# Patient Record
Sex: Female | Born: 1935 | Race: White | Hispanic: No | Smoking: Never smoker
Health system: Southern US, Community
[De-identification: ages and names within clinical notes are randomized; demographics above are authoritative.]

## PROBLEM LIST (undated history)

## (undated) DIAGNOSIS — M6281 Muscle weakness (generalized): Secondary | ICD-10-CM

## (undated) DIAGNOSIS — F0390 Unspecified dementia without behavioral disturbance: Secondary | ICD-10-CM

## (undated) DIAGNOSIS — K219 Gastro-esophageal reflux disease without esophagitis: Secondary | ICD-10-CM

## (undated) DIAGNOSIS — F419 Anxiety disorder, unspecified: Secondary | ICD-10-CM

## (undated) DIAGNOSIS — K5790 Diverticulosis of intestine, part unspecified, without perforation or abscess without bleeding: Secondary | ICD-10-CM

## (undated) DIAGNOSIS — E039 Hypothyroidism, unspecified: Secondary | ICD-10-CM

## (undated) DIAGNOSIS — E119 Type 2 diabetes mellitus without complications: Secondary | ICD-10-CM

---

## 2004-07-22 ENCOUNTER — Emergency Department: Payer: Self-pay | Admitting: Emergency Medicine

## 2004-07-22 ENCOUNTER — Other Ambulatory Visit: Payer: Self-pay

## 2005-12-19 ENCOUNTER — Other Ambulatory Visit: Payer: Self-pay

## 2005-12-19 ENCOUNTER — Emergency Department: Payer: Self-pay | Admitting: Emergency Medicine

## 2006-06-01 ENCOUNTER — Other Ambulatory Visit: Payer: Self-pay

## 2006-06-01 ENCOUNTER — Emergency Department: Payer: Self-pay | Admitting: Emergency Medicine

## 2006-06-18 ENCOUNTER — Ambulatory Visit: Payer: Self-pay | Admitting: Gastroenterology

## 2006-12-18 ENCOUNTER — Other Ambulatory Visit: Payer: Self-pay

## 2006-12-18 ENCOUNTER — Inpatient Hospital Stay: Payer: Self-pay | Admitting: *Deleted

## 2007-01-05 ENCOUNTER — Emergency Department: Payer: Self-pay | Admitting: Unknown Physician Specialty

## 2008-04-19 ENCOUNTER — Other Ambulatory Visit: Payer: Self-pay

## 2008-04-19 ENCOUNTER — Emergency Department: Payer: Self-pay | Admitting: Internal Medicine

## 2008-11-02 ENCOUNTER — Emergency Department: Payer: Self-pay

## 2008-12-24 ENCOUNTER — Emergency Department: Payer: Self-pay | Admitting: Emergency Medicine

## 2010-03-08 ENCOUNTER — Emergency Department: Payer: Self-pay | Admitting: Emergency Medicine

## 2010-04-17 ENCOUNTER — Emergency Department: Payer: Self-pay | Admitting: Emergency Medicine

## 2010-10-04 IMAGING — CR DG SHOULDER 3+V*R*
1 series · 3 of 3 positions shown · non-contrast
Comparison: none

REASON FOR EXAM: pain and swelling
COMMENTS:

PROCEDURE:     DXR - DXR SHOULDER RIGHT COMPLETE  - April 17, 2010  [DATE]
RESULT:
A comminuted humeral head fracture is appreciated. There does not appear to
be significant angulation or displacement. This extends into the humeral
head and neck and proximal humeral shaft.

[Series 1: view not recorded · 0.17mm/px · 3 of 3 slices shown]
[im 1/3]
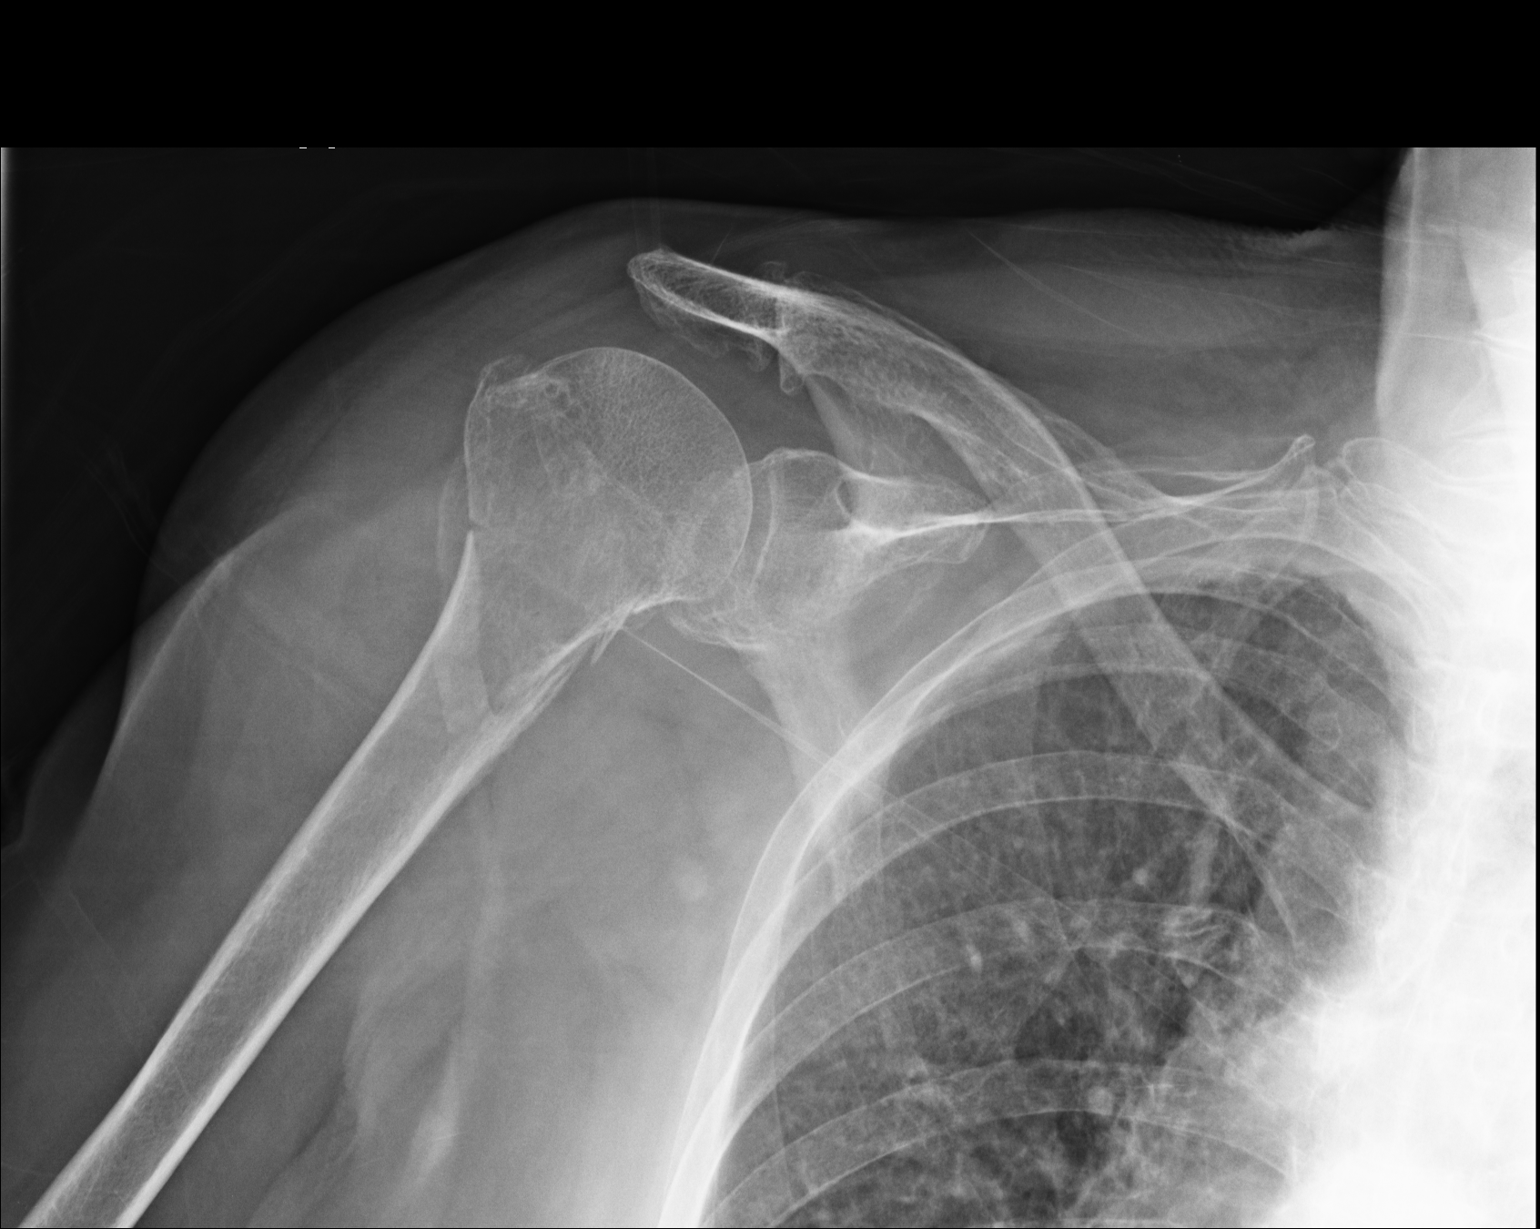
[im 2/3]
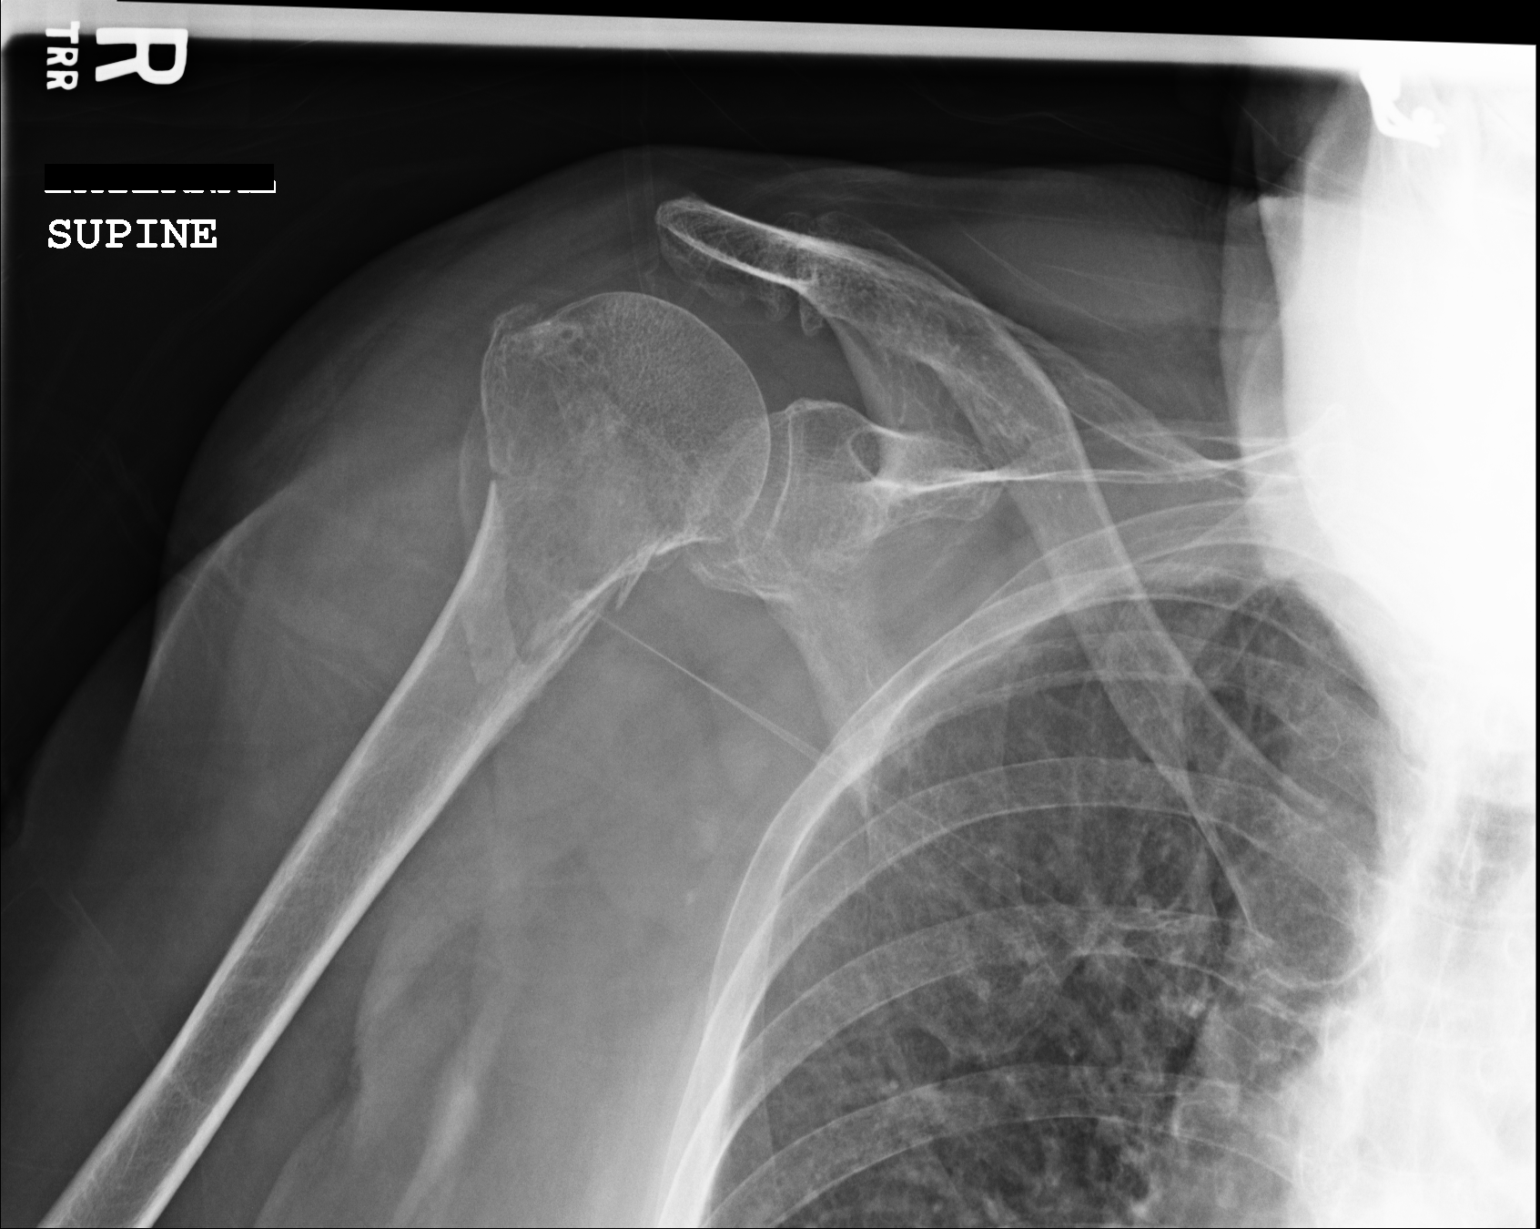
[im 3/3]
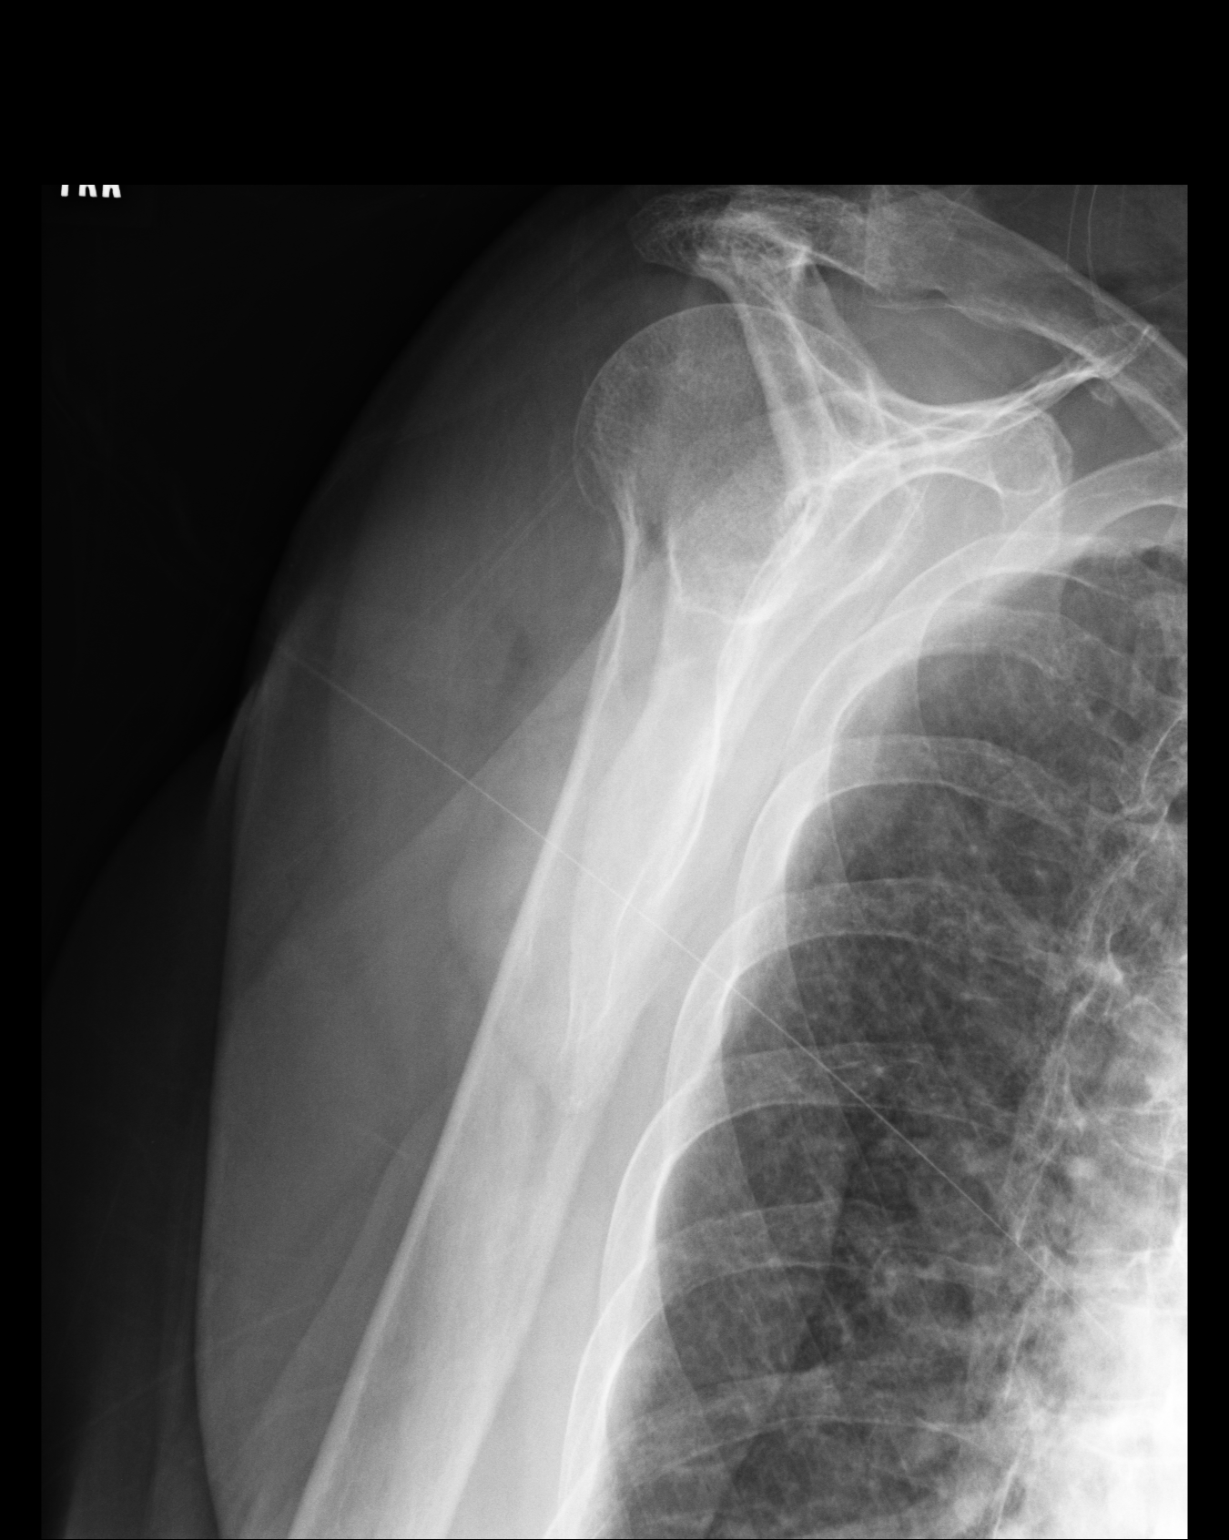

[3 of 3 positions shown; findings below may reference images not displayed]

IMPRESSION: Humeral head and neck fracture as described above with
extension into the humeral shaft.

## 2012-07-19 ENCOUNTER — Emergency Department: Payer: Self-pay | Admitting: Emergency Medicine

## 2012-07-20 LAB — URINALYSIS, COMPLETE
Bilirubin,UR: NEGATIVE
Blood: NEGATIVE
Glucose,UR: NEGATIVE mg/dL (ref 0–75)
Ketone: NEGATIVE
Ph: 6 (ref 4.5–8.0)
RBC,UR: <1 /HPF (ref 0–5)
Squamous Epithelial: NONE SEEN

## 2012-07-20 LAB — CBC
HCT: 39 % (ref 35.0–47.0)
HGB: 13.1 g/dL (ref 12.0–16.0)
MCHC: 33.7 g/dL (ref 32.0–36.0)
Platelet: 165 10*3/uL (ref 150–440)
RBC: 4.35 10*6/uL (ref 3.80–5.20)
RDW: 13.9 % (ref 11.5–14.5)

## 2012-07-20 LAB — COMPREHENSIVE METABOLIC PANEL
Albumin: 3.5 g/dL (ref 3.4–5.0)
Anion Gap: 5 — ABNORMAL LOW (ref 7–16)
BUN: 19 mg/dL — ABNORMAL HIGH (ref 7–18)
Calcium, Total: 8.8 mg/dL (ref 8.5–10.1)
EGFR (African American): >60
Glucose: 100 mg/dL — ABNORMAL HIGH (ref 65–99)
Potassium: 4.1 mmol/L (ref 3.5–5.1)
SGOT(AST): 26 U/L (ref 15–37)
SGPT (ALT): 25 U/L (ref 12–78)
Sodium: 136 mmol/L (ref 136–145)
Total Protein: 7.2 g/dL (ref 6.4–8.2)

## 2012-07-20 LAB — CK TOTAL AND CKMB (NOT AT ARMC): CK, Total: 35 U/L (ref 21–215)

## 2013-05-09 ENCOUNTER — Emergency Department: Payer: Self-pay | Admitting: Emergency Medicine

## 2013-05-09 LAB — CBC WITH DIFFERENTIAL/PLATELET
Basophil #: 0.1 10*3/uL (ref 0.0–0.1)
Eosinophil #: 0.3 10*3/uL (ref 0.0–0.7)
HGB: 13.6 g/dL (ref 12.0–16.0)
MCH: 30.1 pg (ref 26.0–34.0)
MCHC: 33.2 g/dL (ref 32.0–36.0)
Monocyte #: 0.7 x10 3/mm (ref 0.2–0.9)
Monocyte %: 7.7 %
Neutrophil #: 6 10*3/uL (ref 1.4–6.5)
RBC: 4.5 10*6/uL (ref 3.80–5.20)
WBC: 8.5 10*3/uL (ref 3.6–11.0)

## 2013-05-09 LAB — PHOSPHORUS: Phosphorus: 3.3 mg/dL (ref 2.5–4.9)

## 2013-05-09 LAB — MAGNESIUM: Magnesium: 2 mg/dL

## 2013-05-10 LAB — LIPASE, BLOOD: Lipase: 288 U/L (ref 73–393)

## 2013-05-10 LAB — URINALYSIS, COMPLETE
Bilirubin,UR: NEGATIVE
Glucose,UR: NEGATIVE mg/dL (ref 0–75)
Ketone: NEGATIVE
Nitrite: NEGATIVE
Ph: 6 (ref 4.5–8.0)
Protein: NEGATIVE
RBC,UR: 6 /HPF (ref 0–5)
WBC UR: 24 /HPF (ref 0–5)

## 2013-05-10 LAB — COMPREHENSIVE METABOLIC PANEL
Anion Gap: 10 (ref 7–16)
BUN: 15 mg/dL (ref 7–18)
Chloride: 98 mmol/L (ref 98–107)
Co2: 24 mmol/L (ref 21–32)
Glucose: 126 mg/dL — ABNORMAL HIGH (ref 65–99)
Osmolality: 267 (ref 275–301)
SGOT(AST): 23 U/L (ref 15–37)
SGPT (ALT): 24 U/L (ref 12–78)
Sodium: 132 mmol/L — ABNORMAL LOW (ref 136–145)
Total Protein: 7.6 g/dL (ref 6.4–8.2)

## 2013-05-10 LAB — CK TOTAL AND CKMB (NOT AT ARMC)
CK, Total: 29 U/L (ref 21–215)
CK-MB: 1.2 ng/mL (ref 0.5–3.6)

## 2013-05-10 LAB — TROPONIN I
Troponin-I: <0.02 ng/mL
Troponin-I: <0.02 ng/mL

## 2014-06-02 ENCOUNTER — Inpatient Hospital Stay: Payer: Self-pay | Admitting: Internal Medicine

## 2014-06-02 LAB — CBC WITH DIFFERENTIAL/PLATELET
BASOS ABS: 0.1 10*3/uL (ref 0.0–0.1)
BASOS PCT: 1 %
Basophil #: 0.1 10*3/uL (ref 0.0–0.1)
Basophil #: 0.1 10*3/uL (ref 0.0–0.1)
Basophil %: 0.8 %
Basophil %: 1.1 %
Eosinophil #: 0.2 10*3/uL (ref 0.0–0.7)
Eosinophil #: 0.2 10*3/uL (ref 0.0–0.7)
Eosinophil #: 0.1 10*3/uL (ref 0.0–0.7)
Eosinophil %: 2.8 %
Eosinophil %: 2.9 %
Eosinophil %: 1.9 %
HCT: 38.8 % (ref 35.0–47.0)
HCT: 38.8 % (ref 35.0–47.0)
HCT: 39.5 % (ref 35.0–47.0)
HGB: 12.9 g/dL (ref 12.0–16.0)
HGB: 12.4 g/dL (ref 12.0–16.0)
HGB: 12.3 g/dL (ref 12.0–16.0)
LYMPHS ABS: 0.9 10*3/uL — AB (ref 1.0–3.6)
LYMPHS ABS: 1.1 10*3/uL (ref 1.0–3.6)
Lymphocyte #: 1.1 10*3/uL (ref 1.0–3.6)
Lymphocyte %: 14.2 %
Lymphocyte %: 17.1 %
Lymphocyte %: 10.9 %
MCH: 30.5 pg (ref 26.0–34.0)
MCH: 29.7 pg (ref 26.0–34.0)
MCH: 29.8 pg (ref 26.0–34.0)
MCHC: 32.8 g/dL (ref 32.0–36.0)
MCHC: 31.6 g/dL — ABNORMAL LOW (ref 32.0–36.0)
MCHC: 32 g/dL (ref 32.0–36.0)
MCV: 93 fL (ref 80–100)
MCV: 94 fL (ref 80–100)
MCV: 93 fL (ref 80–100)
MONOS PCT: 9 %
MONOS PCT: 9.6 %
Monocyte #: 0.7 x10 3/mm (ref 0.2–0.9)
Monocyte #: 0.6 x10 3/mm (ref 0.2–0.9)
Monocyte #: 0.7 x10 3/mm (ref 0.2–0.9)
Monocyte %: 9.7 %
NEUTROS ABS: 5.5 10*3/uL (ref 1.4–6.5)
NEUTROS PCT: 72.4 %
NEUTROS PCT: 77.1 %
Neutrophil #: 4.6 10*3/uL (ref 1.4–6.5)
Neutrophil #: 6 10*3/uL (ref 1.4–6.5)
Neutrophil %: 69.5 %
PLATELETS: 184 10*3/uL (ref 150–440)
PLATELETS: 192 10*3/uL (ref 150–440)
Platelet: 171 10*3/uL (ref 150–440)
RBC: 4.25 10*6/uL (ref 3.80–5.20)
RBC: 4.13 10*6/uL (ref 3.80–5.20)
RBC: 4.16 10*6/uL (ref 3.80–5.20)
RDW: 13.9 % (ref 11.5–14.5)
RDW: 13.8 % (ref 11.5–14.5)
RDW: 13.7 % (ref 11.5–14.5)
WBC: 7.6 10*3/uL (ref 3.6–11.0)
WBC: 6.6 10*3/uL (ref 3.6–11.0)
WBC: 7.8 10*3/uL (ref 3.6–11.0)

## 2014-06-02 LAB — COMPREHENSIVE METABOLIC PANEL
Albumin: 3.2 g/dL — ABNORMAL LOW (ref 3.4–5.0)
Alkaline Phosphatase: 42 U/L — ABNORMAL LOW
Anion Gap: 8 (ref 7–16)
BUN: 14 mg/dL (ref 7–18)
Bilirubin,Total: 0.4 mg/dL (ref 0.2–1.0)
CALCIUM: 8.3 mg/dL — AB (ref 8.5–10.1)
CREATININE: 0.82 mg/dL (ref 0.60–1.30)
Chloride: 100 mmol/L (ref 98–107)
Co2: 28 mmol/L (ref 21–32)
EGFR (African American): >60
GLUCOSE: 94 mg/dL (ref 65–99)
OSMOLALITY: 272 (ref 275–301)
Potassium: 3.9 mmol/L (ref 3.5–5.1)
SGOT(AST): 26 U/L (ref 15–37)
SGPT (ALT): 22 U/L
Sodium: 136 mmol/L (ref 136–145)
Total Protein: 6.7 g/dL (ref 6.4–8.2)

## 2014-06-02 LAB — TROPONIN I: Troponin-I: <0.02 ng/mL

## 2014-06-02 LAB — CK TOTAL AND CKMB (NOT AT ARMC)
CK, Total: 57 U/L
CK-MB: 2 ng/mL (ref 0.5–3.6)

## 2014-06-02 LAB — OCCULT BLOOD X 1 CARD TO LAB, STOOL: Occult Blood, Feces: NEGATIVE

## 2014-06-03 LAB — URINALYSIS, COMPLETE
BILIRUBIN, UR: NEGATIVE
Blood: NEGATIVE
GLUCOSE, UR: NEGATIVE mg/dL (ref 0–75)
Ketone: NEGATIVE
Nitrite: NEGATIVE
PH: 5 (ref 4.5–8.0)
PROTEIN: NEGATIVE
SPECIFIC GRAVITY: 1.009 (ref 1.003–1.030)
Squamous Epithelial: 2
WBC UR: 198 /HPF (ref 0–5)

## 2014-06-03 LAB — HEMOGLOBIN: HGB: 12.2 g/dL (ref 12.0–16.0)

## 2014-06-04 LAB — CBC WITH DIFFERENTIAL/PLATELET
BASOS PCT: 0.5 %
Basophil #: 0 10*3/uL (ref 0.0–0.1)
EOS PCT: 2.6 %
Eosinophil #: 0.2 10*3/uL (ref 0.0–0.7)
HCT: 37.9 % (ref 35.0–47.0)
HGB: 12.4 g/dL (ref 12.0–16.0)
Lymphocyte #: 0.6 10*3/uL — ABNORMAL LOW (ref 1.0–3.6)
Lymphocyte %: 8.2 %
MCH: 30.6 pg (ref 26.0–34.0)
MCHC: 32.8 g/dL (ref 32.0–36.0)
MCV: 93 fL (ref 80–100)
Monocyte #: 0.7 x10 3/mm (ref 0.2–0.9)
Monocyte %: 9.9 %
NEUTROS PCT: 78.8 %
Neutrophil #: 6 10*3/uL (ref 1.4–6.5)
Platelet: 165 10*3/uL (ref 150–440)
RBC: 4.07 10*6/uL (ref 3.80–5.20)
RDW: 13.8 % (ref 11.5–14.5)
WBC: 7.6 10*3/uL (ref 3.6–11.0)

## 2014-06-05 LAB — URINE CULTURE

## 2014-06-06 ENCOUNTER — Encounter: Payer: Self-pay | Admitting: Internal Medicine

## 2014-06-25 ENCOUNTER — Encounter: Payer: Self-pay | Admitting: Internal Medicine

## 2014-07-25 ENCOUNTER — Encounter: Payer: Self-pay | Admitting: Internal Medicine

## 2014-08-25 ENCOUNTER — Encounter: Payer: Self-pay | Admitting: Internal Medicine

## 2014-12-16 NOTE — H&P (Signed)
PATIENT NAME:  Madison Wallace, Madison Wallace MR#:  [id number] DATE OF BIRTH:  [birth year]  DATE OF ADMISSION:  06/02/2014  REFERRING PHYSICIAN:  Enedina Finnerandolph N. Manson PasseyBrown, MD   PRIMARY CARE PHYSICIAN:  [facility], Lorayne BenderKatherine Jones, PA-C   CHIEF COMPLAINT:  Bright red blood per rectum.   HISTORY OF PRESENT ILLNESS:  A 81 year old Caucasian female with past medical history of dementia, as well as gastroesophageal reflux disease and anxiety not otherwise specified, presenting with bright red blood per rectum. The [facility] [place]. Apparently, she was going to the restroom and nursing staff noted bright red blood per rectum in the toilet bowl, thus sent the patient to the hospital for further workup and evaluation. The patient herself is unable to provide any reliable information given baseline mental status; however, she denies any chest pain, palpitations, or further symptomatology. She has had no further episodes of bleeding thus far in the Emergency Department.   REVIEW OF SYSTEMS:  Unobtainable given the patient's baseline mental status.   PAST MEDICAL HISTORY:  Type 2 diabetes on no medication, gastroesophageal reflux disease, anxiety and depression not otherwise specified.   SOCIAL HISTORY:  No alcohol, tobacco, or drug usage. She ambulates with a walker at baseline.   FAMILY HISTORY:  Positive for coronary artery disease.   ALLERGIES:  AMOXICILLIN AND PENICILLIN.   HOME MEDICATIONS:  Include Tylenol 500 mg p.o. at bedtime, lorazepam 0.5 mg 1/2 tab p.o. at bedtime, sertraline 50 mg p.o. daily, BuSpar 7.5 mg p.o. b.i.d., Aricept 10 mg p.o. at bedtime, nystatin topical 100,000 units to affected area b.i.d., levothyroxine 25 mcg p.o. daily, multivitamin 1 tab p.o. daily, vitamin B12 at 1000 mcg p.o. daily.   PHYSICAL EXAMINATION: VITAL SIGNS:  Temperature 96.6, heart rate 63, respirations 21, blood pressure 119/47, saturating 99% on room air. Weight 57.4 kg, BMI 21.7.   GENERAL:  Chronically ill-appearing Caucasian female, currently in no acute distress.  HEAD:  Normocephalic, atraumatic.  EYES:  Pupils are equal, round, and reactive. Extraocular muscles are intact. No scleral icterus.  MOUTH:  Moist mucosal membranes. Dentition is intact. No abscess noted.  EARS, NOSE, AND THROAT:  Clear without exudates. No external lesions.  NECK:  Supple. No thyromegaly or nodules. No JVD.  PULMONARY:  Clear to auscultation bilaterally without wheezes, rales, or rhonchi. Good air entry. Good respiratory effort.  CHEST:  Nontender to palpation.  CARDIOVASCULAR:  S1, S2, regular rate and rhythm. No murmurs, rubs, or gallops. No edema. Pedal pulses 2+ bilaterally.  GASTROINTESTINAL:  Soft, nontender, nondistended. No masses. Positive bowel sounds. No hepatosplenomegaly.  MUSCULOSKELETAL:  No swelling, clubbing, or edema. Range of motion is full in all extremities.  NEUROLOGIC:  Cranial nerves II through XII are intact. No gross focal neurological deficits. Sensation is intact. Reflexes are intact.  SKIN:  No ulceration, lesion, rash, or cyanosis. Skin is warm and dry. Turgor is intact.  PSYCHIATRIC:  Mood and affect are blunted. She does have some difficulty answering questions; however, she is able to follow simple commands. She is awake, alert, and oriented to person, however, not to place or time; this is baseline per the family at bedside. Insight and judgment appear to be poor.   LABORATORY DATA:  Sodium 136, potassium 3.9, chloride 100, bicarbonate 28, BUN 14, creatinine 0.82, glucose 94. LFTs: Albumin 3.2, otherwise within normal limits. WBC 7.6, hemoglobin 12.9, platelets 192.   ASSESSMENT AND PLAN:  A 81 year old Caucasian female with history of dementia, presenting with bright red blood  per rectum.   1.  Bright red blood per rectum, hemoglobin stable on arrival to the Emergency Department as well as blood pressure stable. Will trend CBC q. 6 hours with transfusion  threshold to keep hemoglobin greater than 7. Consult gastroenterology.  2.  Hypothyroidism. Continue with Synthroid.  3.  Anxiety and depression not otherwise specified. Continue BuSpar. 4.  Venous thromboembolism prophylaxis with sequential compression devices.  CODE STATUS:  The patient is a full code per family at bedside.  TIME SPENT:  45 minutes.    ____________________________ Madison Wallace. Hower, MD dkh:nb D: 06/02/2014 02:40:54 ET T: 06/02/2014 03:24:10 ET JOB#: [id number]  cc: Madison Wallace. Hower, MD, <Dictator> DAVID Synetta Shadow MD ELECTRONICALLY SIGNED 06/02/2014 20:35

## 2014-12-16 NOTE — Discharge Summary (Signed)
PATIENT NAME:  Madison Wallace, Madison Wallace MR#:  [id number] DATE OF BIRTH:  1917-08-05  DATE OF ADMISSION:  06/02/2014 DATE OF DISCHARGE:  06/05/2014  PRIMARY CARE PHYSICIAN: Dr. Micah NoelLance.  CONSULTATION: Dr. Marva PandaSkulskie.  DISCHARGE DIAGNOSES: Gastrointestinal bleeding, sigmoid diverticulosis, hemorrhoid, urinary tract infection, diabetes 2, dementia.   CONDITION: Stable.   CODE STATUS: DNR.   HOME MEDICATIONS: Please refer to the medication reconciliation list.   DIET: ADA diet.   ACTIVITY: As tolerated.   FOLLOW-UP CARE: Follow with PCP and Dr. Marva PandaSkulskie within 1 to 2 weeks. In addition, the patient needs aspiration precautions and a low residual diet.   REASON FOR ADMISSION: Bright blood per rectal.   HOSPITAL COURSE: The patient is a 81 year old, Caucasian female, with a history of dementia, GERD, anxiety was sent to the ED from rest home due to rectal bleeding. For a detailed history and physical examination, please refer to the admission note dictated by Dr. Clint GuyHower.   1. On the admission date, the patient's BMP was normal, glucose 94. Liver function tests, albumin was 3.2, otherwise within normal limits. WBC 7.6, hemoglobin 12.9, platelets 192,000. The patient was admitted for GI bleeding. After admission, the patient has been treated with Protonix b.i.d. In addition, recheck hemoglobin, which is stable. Dr. Marva PandaSkulskie evaluated the patient. Suggested medical treatment without endoscopy. The patient's CAT scan of the abdomen and pelvis showed sigmoid diverticulosis without diverticulitis.  2. Altered mental status, possibly due to acute metabolic encephalopathy, secondary to urinary tract infection and dementia. The patient's mental status worsened on the second day after admission, so we checked a urinalysis, which showed a urinary tract infection; however, the patient has no leukocytosis. We started Cipro and urine cultures showed Escherichia coli sensitive to Cipro.   The patient is demented, but has  no complaints. Vital signs are stable. She is clinically stable and will be discharged to a skilled nursing facility today.   I discussed the patient's discharge plan with the patient's daughter, nurse and social worker.    TIME SPENT: About 36 minutes.    ____________________________ Shaune PollackQing Kaneisha Ellenberger, MD qc:JT D: 06/05/2014 10:51:00 ET T: 06/05/2014 11:28:27 ET JOB#: [id number]  cc: Shaune PollackQing Dashauna Heymann, MD, <Dictator> Shaune PollackQING Sianne Tejada MD ELECTRONICALLY SIGNED 06/05/2014 13:15

## 2014-12-16 NOTE — Consult Note (Signed)
MR#:  [id number] DATE OF BIRTH:  [birth year]  DATE OF CONSULTATION:  06/02/2014  REFERRING PHYSICIAN:  Cletis Athens. Hower, MD CONSULTING PHYSICIAN:  Ranae Plumber. Arvilla Market, ANP (Adult Nurse Practitioner) / Barnetta Chapel, MD, OD  NOTICE: I normally work with Dr. Lynnae Prude, but this time I am working with Dr. Marva Panda.  REASON FOR CONSULTATION: Bright red rectal bleeding.   HISTORY OF PRESENT ILLNESS: This 81 year old patient with a past medical history of dementia, is living at [facility] in assisted living. The  nursing staff reported bright red rectal bleeding last night with a bowel movement. No other details are available. I was talking with her daughter today, [name], who called the nursing home and could get no further information. The patient had a subsequent bowel movement last evening that was brown in color, with no evidence of rectal bleeding. Today, she has offered no GI complaints, and has had a small brown bowel movement reported. The patient is unable to give a very lucid history, but does report that her eyes are watering, her neck hurts, her feet hurt, but she offers no GI complaints. According to [facility], this patient has had a remote colonoscopy over 20 years ago. She has had no significant GI history.   PAST MEDICAL HISTORY:  1.  Dementia.  2.  Type 2 diabetes mellitus.  3.  GERD.  4.  Anxiety and depression.  5.  Osteoporosis.   PAST SURGICAL HISTORY:  1.  Left knee surgery.  2.  Left arm surgery.  3.  Cholecystectomy.   HOME MEDICATIONS:  Per admission list to include:  1.  Tylenol 500 mg at bedtime.  2.  Lorazepam 0.5 mg half tablet at bedtime.  3.  Sertraline 50 mg daily.  4.  BuSpar 7.5 mg twice daily.  5.  Aricept 10 mg at bedtime.  6.  Nystatin topical 100,000 units to affected area twice daily.  7.  Levothyroxine 25 mcg daily.  8.  Multivitamin daily.  9.  B12 1000 mcg p.o. daily.   ALLERGIES: AMOXICILLIN AND PENICILLIN.   HABITS: Negative tobacco or  alcohol use.   FAMILY HISTORY: Positive for coronary disease. Negative for colon cancer, according to [facility].   REVIEW OF SYSTEMS: Unable to obtain accurate review of systems.   PHYSICAL EXAMINATION:  VITAL SIGNS: Temperature 97.6, heart rate 59, respiratory rate 18, blood pressure 130/68, pulse oximetry room air is 94%.  GENERAL: Elderly Caucasian female resting in the bed. She is awake, participating in conversation. She is very hard of hearing.  HEENT: Head is normocephalic. Conjunctivae pink. Sclerae anicteric. Oral mucosa is moist and intact.  NECK: Supple. Trachea midline.  HEART: Heart tones S1, S2 with positive systolic murmur noted.  LUNGS: CTA. Respirations are nonlabored.  ABDOMEN: Soft. Bowel sounds are present. The patient does report mild tenderness in the left lower quadrant and suprapubic area.  MUSCULOSKELETAL: No joint obvious swelling or inflammation noted.  SKIN: Has maceration noted in the perianal area. There is a ring around this to suggest the possibility of yeast. She is on nystatin. The daughter reports that this rash has been present for a long time, with numerous ointments attempted without success on resolution. The patient is incontinent of urine.  RECTAL: Exam shows old hemorrhoid tags, smooth walls, no obvious hemorrhoid is visible or palpable. She has light brown stool. No evidence of bright red blood or maroon or melena noted. Guaiac was not obtained.  EXTREMITIES: Lower extremities without edema, cyanosis, or clubbing.  SKIN: Warm and dry. Muscle wasting noted.  PSYCHIATRIC: She is pleasant, trying to participate, aware that she is in the hospital, able to give me her name. She is not exactly following the conversation very well. Daughter reports that the patient does ask the same questions over and over. This is her baseline.   LABORATORY DATA: Admission blood work with normal electrolytes, BUN 14, creatinine 0.82, albumin is a little low 3.2, alkaline  phosphatase 42, ALT 22. CPK-MB and troponin negative. WBC 12.9-12.4, stable range.   RADIOLOGY: No reports to review.  A CT study of the abdomen has been ordered.   IMPRESSION: The  is a 81 year old patient with a history of dementia, diabetes mellitus, and reflux, that was reported to have passed bright red rectal bleeding last night when she was up to the bathroom. Nursing staff at [facility] saw fresh blood on the tissue and possibly in the commode associated with a bowel movement. She has reported 2 bowel movements since then that were brown, and my rectal exam shows light brown stool without obvious bleeding. The patient does have mild tenderness on the left abdomen with palpation. She is an inconsistent historian d/t dementia. Etiology for her rectal bleeding could certainly be an internal hemorrhoids or other lower outlet source, no evidience for a diverticular bleed at this time. The possibility of the beginning diverticulitis is considered, with  her possible tenderness in the left lower quadrant, and we will await CT of the abdomen and pelvis for confirmation. The patient does not appear to be in need of a luminal evaluation at this time, with stable hemoglobin and no evidence of rectal bleeding. At age 81, would not want to pursue luminal evaluation unless it was life-saving.   This case was discussed with Dr. Marva PandaSkulskie in collaboration of care. Further GI recommendations pending her laboratory studies and CT finding.   Thank you for this consultation.    These services were provided by Cala BradfordKimberly A. Arvilla MarketMills, ANP, under collaborative agreement with Dr. Barnetta ChapelMartin Skulskie.   ____________________________ Ranae PlumberKimberly A. Arvilla MarketMills, ANP (Adult Nurse Practitioner) kam:MT D: 06/02/2014 12:29:23 ET T: 06/02/2014 12:51:41 ET JOB#: [id number]  cc: Cala BradfordKimberly A. Arvilla MarketMills, ANP (Adult Nurse Practitioner), <Dictator>  Ranae PlumberKimberly A. Suzette BattiestMills RN, MSN, ANP-BC Adult Nurse Practitioner ELECTRONICALLY SIGNED 06/02/2014  14:17

## 2014-12-16 NOTE — Consult Note (Signed)
Chief Complaint:  Subjective/Chief Complaint seen for possible rectal bleeding, PTA.  no evidence of bleeding since admission, denies abdo pain nausea or vomiting. bm normal in appearance.   VITAL SIGNS/ANCILLARY NOTES: **Vital Signs.:   10-Oct-15 12:53  Vital Signs Type Routine  Temperature Temperature (F) 98.1  Celsius 36.7  Pulse Pulse 51  Respirations Respirations 19  Systolic BP Systolic BP 123  Diastolic BP (mmHg) Diastolic BP (mmHg) 60  Mean BP 81  Pulse Ox % Pulse Ox % 95  Pulse Ox Activity Level  At rest  Oxygen Delivery Room Air/ 21 %   Brief Assessment:  Cardiac Irregular   Respiratory clear BS   Gastrointestinal details normal Soft  Nontender  Nondistended  Bowel sounds normal   Lab Results: Routine Sero:  09-Oct-15 13:12   Occult Blood, Feces NEGATIVE (Result(s) reported on 02 Jun 2014 at 01:45PM.)  Routine Hem:  08-Oct-15 23:48   Hemoglobin (CBC) 12.9  09-Oct-15 06:02   Hemoglobin (CBC) 12.3    11:34   Hemoglobin (CBC) 12.4  10-Oct-15 04:35   Hemoglobin (CBC) 12.2 (Result(s) reported on 03 Jun 2014 at 05:53AM.)   Assessment/Plan:  Assessment/Plan:  Assessment 1) rectal bleeding prior to admission.  no recurrence. no evidence ot bleeding on exam, heme neg, stable hemodynamically and hgb.  2) mild disorientation   Plan 1) finish 5 day course of emperic hemorrhoid treatment.  will sign off. reconsult as needed.   Electronic Signatures: Barnetta ChapelSkulskie, Zanai Mallari (MD)  (Signed 10-Oct-15 13:31)  Authored: Chief Complaint, VITAL SIGNS/ANCILLARY NOTES, Brief Assessment, Lab Results, Assessment/Plan   Last Updated: 10-Oct-15 13:31 by Barnetta ChapelSkulskie, Jozi Malachi (MD)

## 2014-12-16 NOTE — Consult Note (Signed)
Chief Complaint:  Subjective/Chief Complaint Patient seen in regard to rectal bleeding yesterday.  No evidence of rectal bleeding since admission.  Heme negative times 2, no abdominal pain, CT showing diverticulosis without diverticulitis, no evidence of mass. Patient labs and hemodynamics stable.   Recommend emperic treatment for hemorrhoids. Will advance diet.  Not a candidate for sedated luminal evaluation.  Discussed with patients family member.  Following.   VITAL SIGNS/ANCILLARY NOTES: **Vital Signs.:   09-Oct-15 13:20  Vital Signs Type Routine  Temperature Temperature (F) 97.9  Celsius 36.6  Pulse Pulse 57  Respirations Respirations 18  Systolic BP Systolic BP 148  Diastolic BP (mmHg) Diastolic BP (mmHg) 65  Mean BP 92  Pulse Ox % Pulse Ox % 96  Pulse Ox Activity Level  At rest  Oxygen Delivery Room Air/ 21 %   Electronic Signatures: Barnetta ChapelSkulskie, Martin (MD)  (Signed 09-Oct-15 16:32)  Authored: Chief Complaint, VITAL SIGNS/ANCILLARY NOTES   Last Updated: 09-Oct-15 16:32 by Barnetta ChapelSkulskie, Martin (MD)

## 2014-12-31 ENCOUNTER — Emergency Department: Payer: Medicare Other

## 2014-12-31 ENCOUNTER — Inpatient Hospital Stay: Payer: Medicare Other

## 2014-12-31 ENCOUNTER — Inpatient Hospital Stay: Payer: Medicare Other | Admitting: Anesthesiology

## 2014-12-31 ENCOUNTER — Inpatient Hospital Stay: Admit: 2014-12-31 | Payer: Medicare Other | Admitting: Surgery

## 2014-12-31 ENCOUNTER — Inpatient Hospital Stay
Admission: EM | Admit: 2014-12-31 | Discharge: 2015-01-03 | DRG: 481 | Disposition: A | Discharge status: Home / Self Care | Payer: Medicare Other | Attending: Internal Medicine | Admitting: Internal Medicine

## 2014-12-31 ENCOUNTER — Encounter
Admission: EM | Disposition: A | Discharge status: Home / Self Care | Payer: Self-pay | Source: Home / Self Care | Attending: Internal Medicine

## 2014-12-31 ENCOUNTER — Encounter: Payer: Self-pay | Admitting: Emergency Medicine

## 2014-12-31 DIAGNOSIS — Y9289 Other specified places as the place of occurrence of the external cause: Secondary | ICD-10-CM

## 2014-12-31 DIAGNOSIS — E119 Type 2 diabetes mellitus without complications: Secondary | ICD-10-CM | POA: Diagnosis present

## 2014-12-31 DIAGNOSIS — S72141A Displaced intertrochanteric fracture of right femur, initial encounter for closed fracture: Secondary | ICD-10-CM | POA: Diagnosis present

## 2014-12-31 DIAGNOSIS — S72001A Fracture of unspecified part of neck of right femur, initial encounter for closed fracture: Secondary | ICD-10-CM

## 2014-12-31 DIAGNOSIS — Z880 Allergy status to penicillin: Secondary | ICD-10-CM

## 2014-12-31 DIAGNOSIS — L299 Pruritus, unspecified: Secondary | ICD-10-CM | POA: Diagnosis not present

## 2014-12-31 DIAGNOSIS — W19XXXA Unspecified fall, initial encounter: Secondary | ICD-10-CM

## 2014-12-31 DIAGNOSIS — E039 Hypothyroidism, unspecified: Secondary | ICD-10-CM | POA: Diagnosis present

## 2014-12-31 DIAGNOSIS — M6281 Muscle weakness (generalized): Secondary | ICD-10-CM | POA: Diagnosis present

## 2014-12-31 DIAGNOSIS — F329 Major depressive disorder, single episode, unspecified: Secondary | ICD-10-CM | POA: Diagnosis present

## 2014-12-31 DIAGNOSIS — R21 Rash and other nonspecific skin eruption: Secondary | ICD-10-CM | POA: Diagnosis present

## 2014-12-31 DIAGNOSIS — Z8781 Personal history of (healed) traumatic fracture: Secondary | ICD-10-CM

## 2014-12-31 DIAGNOSIS — Y939 Activity, unspecified: Secondary | ICD-10-CM

## 2014-12-31 DIAGNOSIS — E43 Unspecified severe protein-calorie malnutrition: Secondary | ICD-10-CM | POA: Diagnosis not present

## 2014-12-31 DIAGNOSIS — Z9889 Other specified postprocedural states: Secondary | ICD-10-CM

## 2014-12-31 DIAGNOSIS — I4891 Unspecified atrial fibrillation: Secondary | ICD-10-CM | POA: Diagnosis not present

## 2014-12-31 DIAGNOSIS — Z681 Body mass index (BMI) 19 or less, adult: Secondary | ICD-10-CM | POA: Diagnosis not present

## 2014-12-31 DIAGNOSIS — F0390 Unspecified dementia without behavioral disturbance: Secondary | ICD-10-CM

## 2014-12-31 DIAGNOSIS — Y92009 Unspecified place in unspecified non-institutional (private) residence as the place of occurrence of the external cause: Secondary | ICD-10-CM

## 2014-12-31 DIAGNOSIS — F419 Anxiety disorder, unspecified: Secondary | ICD-10-CM | POA: Diagnosis present

## 2014-12-31 DIAGNOSIS — F0280 Dementia in other diseases classified elsewhere without behavioral disturbance: Secondary | ICD-10-CM | POA: Diagnosis present

## 2014-12-31 DIAGNOSIS — K219 Gastro-esophageal reflux disease without esophagitis: Secondary | ICD-10-CM | POA: Diagnosis present

## 2014-12-31 DIAGNOSIS — Z79899 Other long term (current) drug therapy: Secondary | ICD-10-CM

## 2014-12-31 DIAGNOSIS — K5790 Diverticulosis of intestine, part unspecified, without perforation or abscess without bleeding: Secondary | ICD-10-CM | POA: Diagnosis present

## 2014-12-31 DIAGNOSIS — Z7952 Long term (current) use of systemic steroids: Secondary | ICD-10-CM | POA: Diagnosis not present

## 2014-12-31 DIAGNOSIS — Z515 Encounter for palliative care: Secondary | ICD-10-CM | POA: Diagnosis not present

## 2014-12-31 DIAGNOSIS — W1830XA Fall on same level, unspecified, initial encounter: Secondary | ICD-10-CM | POA: Diagnosis not present

## 2014-12-31 DIAGNOSIS — Z66 Do not resuscitate: Secondary | ICD-10-CM | POA: Diagnosis present

## 2014-12-31 HISTORY — DX: Gastro-esophageal reflux disease without esophagitis: K21.9

## 2014-12-31 HISTORY — DX: Type 2 diabetes mellitus without complications: E11.9

## 2014-12-31 HISTORY — DX: Muscle weakness (generalized): M62.81

## 2014-12-31 HISTORY — DX: Hypothyroidism, unspecified: E03.9

## 2014-12-31 HISTORY — DX: Unspecified dementia, unspecified severity, without behavioral disturbance, psychotic disturbance, mood disturbance, and anxiety: F03.90

## 2014-12-31 HISTORY — DX: Diverticulosis of intestine, part unspecified, without perforation or abscess without bleeding: K57.90

## 2014-12-31 HISTORY — PX: COMPRESSION HIP SCREW: SHX1386

## 2014-12-31 HISTORY — DX: Anxiety disorder, unspecified: F41.9

## 2014-12-31 LAB — CBC WITH DIFFERENTIAL/PLATELET
Basophils Absolute: 0.1 10*3/uL (ref 0–0.1)
Basophils Relative: 1 %
EOS ABS: 0.3 10*3/uL (ref 0–0.7)
EOS PCT: 4 %
HEMATOCRIT: 39.4 % (ref 35.0–47.0)
Hemoglobin: 13.1 g/dL (ref 12.0–16.0)
LYMPHS PCT: 23 %
Lymphs Abs: 1.5 10*3/uL (ref 1.0–3.6)
MCH: 30.8 pg (ref 26.0–34.0)
MCHC: 33.2 g/dL (ref 32.0–36.0)
MCV: 92.9 fL (ref 80.0–100.0)
MONO ABS: 0.6 10*3/uL (ref 0.2–0.9)
Monocytes Relative: 10 %
Neutro Abs: 3.9 10*3/uL (ref 1.4–6.5)
Neutrophils Relative %: 62 %
PLATELETS: 219 10*3/uL (ref 150–440)
RBC: 4.24 MIL/uL (ref 3.80–5.20)
RDW: 14.9 % — ABNORMAL HIGH (ref 11.5–14.5)
WBC: 6.3 10*3/uL (ref 3.6–11.0)

## 2014-12-31 LAB — CBC
HCT: 37 % (ref 35.0–47.0)
Hemoglobin: 12.1 g/dL (ref 12.0–16.0)
MCH: 30.5 pg (ref 26.0–34.0)
MCHC: 32.8 g/dL (ref 32.0–36.0)
MCV: 92.8 fL (ref 80.0–100.0)
Platelets: 201 10*3/uL (ref 150–440)
RBC: 3.98 MIL/uL (ref 3.80–5.20)
RDW: 14.6 % — ABNORMAL HIGH (ref 11.5–14.5)
WBC: 10 10*3/uL (ref 3.6–11.0)

## 2014-12-31 LAB — CREATININE, SERUM
Creatinine, Ser: 0.65 mg/dL (ref 0.44–1.00)
GFR calc Af Amer: >60 mL/min (ref >60)
GFR calc non Af Amer: >60 mL/min (ref >60)

## 2014-12-31 LAB — URINALYSIS COMPLETE WITH MICROSCOPIC (ARMC ONLY)
Bilirubin Urine: NEGATIVE
Glucose, UA: NEGATIVE mg/dL
Ketones, ur: NEGATIVE mg/dL
Nitrite: NEGATIVE
Protein, ur: NEGATIVE mg/dL
Specific Gravity, Urine: 1.008 (ref 1.005–1.030)
pH: 7 (ref 5.0–8.0)

## 2014-12-31 LAB — COMPREHENSIVE METABOLIC PANEL
ALK PHOS: 53 U/L (ref 38–126)
ALT: 18 U/L (ref 14–54)
AST: 21 U/L (ref 15–41)
Albumin: 4 g/dL (ref 3.5–5.0)
Anion gap: 8 (ref 5–15)
BILIRUBIN TOTAL: 0.5 mg/dL (ref 0.3–1.2)
BUN: 15 mg/dL (ref 6–20)
CHLORIDE: 99 mmol/L — AB (ref 101–111)
CO2: 30 mmol/L (ref 22–32)
Calcium: 9.1 mg/dL (ref 8.9–10.3)
Creatinine, Ser: 0.74 mg/dL (ref 0.44–1.00)
GFR calc Af Amer: >60 mL/min (ref >60)
Glucose, Bld: 107 mg/dL — ABNORMAL HIGH (ref 65–99)
POTASSIUM: 3.8 mmol/L (ref 3.5–5.1)
Sodium: 137 mmol/L (ref 135–145)
Total Protein: 7.4 g/dL (ref 6.5–8.1)

## 2014-12-31 LAB — PROTIME-INR
INR: 0.93
Prothrombin Time: 12.7 seconds (ref 11.4–15.0)

## 2014-12-31 LAB — ABO/RH: ABO/RH(D): A POS

## 2014-12-31 LAB — TYPE AND SCREEN
ABO/RH(D): A POS
Antibody Screen: NEGATIVE

## 2014-12-31 LAB — APTT: aPTT: 26 seconds (ref 24–36)

## 2014-12-31 LAB — TROPONIN I: Troponin I: <0.03 ng/mL (ref <0.031)

## 2014-12-31 SURGERY — COMPRESSION HIP
Anesthesia: Choice | Laterality: Right

## 2014-12-31 SURGERY — COMPRESSION HIP
Anesthesia: Spinal | Laterality: Right

## 2014-12-31 MED ORDER — BUSPIRONE HCL 5 MG PO TABS
15.0000 mg | ORAL_TABLET | Freq: Three times a day (TID) | ORAL | Status: DC
  Administered 2014-12-31 – 2015-01-03 (×8): 15 mg via ORAL
  Filled 2014-12-31 (×9): qty 3

## 2014-12-31 MED ORDER — SENNOSIDES-DOCUSATE SODIUM 8.6-50 MG PO TABS: 1.0000 | ORAL_TABLET | Freq: Every evening | ORAL | Status: DC | PRN

## 2014-12-31 MED ORDER — PRAMOXINE-CALAMINE 1-3 % EX LOTN: 1.0000 "application " | TOPICAL_LOTION | Freq: Two times a day (BID) | CUTANEOUS | Status: DC | PRN

## 2014-12-31 MED ORDER — FENTANYL CITRATE (PF) 100 MCG/2ML IJ SOLN: 25.0000 ug | INTRAMUSCULAR | Status: DC | PRN

## 2014-12-31 MED ORDER — ACETAMINOPHEN 325 MG PO TABS
650.0000 mg | ORAL_TABLET | Freq: Four times a day (QID) | ORAL | Status: DC | PRN
  Administered 2015-01-02: 650 mg via ORAL
  Filled 2014-12-31: qty 2

## 2014-12-31 MED ORDER — BUPIVACAINE HCL (PF) 0.5 % IJ SOLN
INTRAMUSCULAR | Status: DC | PRN
  Administered 2014-12-31: 12.5 mg via INTRATHECAL

## 2014-12-31 MED ORDER — HYDROCORTISONE 2.5 % EX CREA
1.0000 "application " | TOPICAL_CREAM | Freq: Two times a day (BID) | CUTANEOUS | Status: DC
  Filled 2014-12-31: qty 30

## 2014-12-31 MED ORDER — CALAMINE EX LOTN
TOPICAL_LOTION | Freq: Two times a day (BID) | CUTANEOUS | Status: DC | PRN
  Filled 2014-12-31: qty 118

## 2014-12-31 MED ORDER — TRAMADOL HCL 50 MG PO TABS
50.0000 mg | ORAL_TABLET | Freq: Four times a day (QID) | ORAL | Status: DC
  Administered 2014-12-31 – 2015-01-02 (×5): 50 mg via ORAL
  Administered 2015-01-02: 100 mg via ORAL
  Administered 2015-01-02: 50 mg via ORAL
  Administered 2015-01-02 – 2015-01-03 (×4): 100 mg via ORAL
  Filled 2014-12-31: qty 2
  Filled 2014-12-31 (×4): qty 1
  Filled 2014-12-31 (×4): qty 2
  Filled 2014-12-31 (×2): qty 1

## 2014-12-31 MED ORDER — NYSTATIN 100000 UNIT/GM EX CREA
1.0000 "application " | TOPICAL_CREAM | Freq: Two times a day (BID) | CUTANEOUS | Status: DC
  Administered 2014-12-31 – 2015-01-03 (×6): 1 via TOPICAL
  Filled 2014-12-31: qty 15

## 2014-12-31 MED ORDER — ONDANSETRON HCL 4 MG PO TABS: 4.0000 mg | ORAL_TABLET | Freq: Four times a day (QID) | ORAL | Status: DC | PRN

## 2014-12-31 MED ORDER — VITAMIN B-12 1000 MCG PO TABS
1000.0000 ug | ORAL_TABLET | Freq: Every day | ORAL | Status: DC
  Administered 2015-01-01 – 2015-01-03 (×3): 1000 ug via ORAL
  Filled 2014-12-31 (×3): qty 1

## 2014-12-31 MED ORDER — PROPOFOL INFUSION 10 MG/ML OPTIME
INTRAVENOUS | Status: DC | PRN
  Administered 2014-12-31: 20 ug/kg/min via INTRAVENOUS

## 2014-12-31 MED ORDER — OXYCODONE HCL 5 MG PO TABS: 5.0000 mg | ORAL_TABLET | ORAL | Status: DC | PRN

## 2014-12-31 MED ORDER — HYDROCORTISONE 1 % EX CREA
TOPICAL_CREAM | Freq: Two times a day (BID) | CUTANEOUS | Status: DC
  Administered 2014-12-31 – 2015-01-02 (×4): via TOPICAL
  Administered 2015-01-02: 1 via TOPICAL
  Administered 2015-01-03: 10:00:00 via TOPICAL
  Filled 2014-12-31: qty 28

## 2014-12-31 MED ORDER — MORPHINE SULFATE 2 MG/ML IJ SOLN
1.0000 mg | INTRAMUSCULAR | Status: DC | PRN
  Administered 2014-12-31 (×2): 1 mg via INTRAVENOUS
  Filled 2014-12-31 (×2): qty 1

## 2014-12-31 MED ORDER — SODIUM CHLORIDE 0.9 % IV SOLN
INTRAVENOUS | Status: AC
  Administered 2014-12-31: 07:00:00 via INTRAVENOUS

## 2014-12-31 MED ORDER — CEFAZOLIN SODIUM 1-5 GM-% IV SOLN
1.0000 g | Freq: Four times a day (QID) | INTRAVENOUS | Status: AC
  Administered 2014-12-31 – 2015-01-01 (×3): 1 g via INTRAVENOUS
  Filled 2014-12-31 (×3): qty 50

## 2014-12-31 MED ORDER — FENTANYL CITRATE (PF) 100 MCG/2ML IJ SOLN
INTRAMUSCULAR | Status: DC | PRN
  Administered 2014-12-31: 25 ug via INTRAVENOUS

## 2014-12-31 MED ORDER — CEFTRIAXONE SODIUM IN DEXTROSE 20 MG/ML IV SOLN
1.0000 g | INTRAVENOUS | Status: DC
  Administered 2015-01-01 – 2015-01-03 (×3): 1 g via INTRAVENOUS
  Filled 2014-12-31 (×6): qty 50

## 2014-12-31 MED ORDER — SERTRALINE HCL 100 MG PO TABS
100.0000 mg | ORAL_TABLET | Freq: Every day | ORAL | Status: DC
  Administered 2015-01-01 – 2015-01-03 (×3): 100 mg via ORAL
  Filled 2014-12-31 (×3): qty 1

## 2014-12-31 MED ORDER — BISACODYL 10 MG RE SUPP
10.0000 mg | Freq: Every day | RECTAL | Status: DC | PRN
  Administered 2015-01-02: 10 mg via RECTAL
  Filled 2014-12-31: qty 1

## 2014-12-31 MED ORDER — FLEET ENEMA 7-19 GM/118ML RE ENEM: 1.0000 | ENEMA | Freq: Once | RECTAL | Status: AC | PRN

## 2014-12-31 MED ORDER — HYDROMORPHONE HCL 1 MG/ML IJ SOLN: 0.2500 mg | INTRAMUSCULAR | Status: DC | PRN

## 2014-12-31 MED ORDER — DOCUSATE SODIUM 100 MG PO CAPS
100.0000 mg | ORAL_CAPSULE | Freq: Two times a day (BID) | ORAL | Status: DC
  Administered 2014-12-31 – 2015-01-03 (×6): 100 mg via ORAL
  Filled 2014-12-31 (×5): qty 1

## 2014-12-31 MED ORDER — METOCLOPRAMIDE HCL 10 MG PO TABS: 5.0000 mg | ORAL_TABLET | Freq: Three times a day (TID) | ORAL | Status: DC | PRN

## 2014-12-31 MED ORDER — ACETAMINOPHEN 500 MG PO TABS
500.0000 mg | ORAL_TABLET | Freq: Every day | ORAL | Status: DC
  Administered 2014-12-31 – 2015-01-02 (×3): 500 mg via ORAL
  Filled 2014-12-31 (×3): qty 1

## 2014-12-31 MED ORDER — ONDANSETRON HCL 4 MG/2ML IJ SOLN: 4.0000 mg | Freq: Four times a day (QID) | INTRAMUSCULAR | Status: DC | PRN

## 2014-12-31 MED ORDER — OCUVITE-LUTEIN PO CAPS
1.0000 | ORAL_CAPSULE | Freq: Every day | ORAL | Status: DC
  Administered 2015-01-03: 1 via ORAL
  Filled 2014-12-31 (×5): qty 1

## 2014-12-31 MED ORDER — ENOXAPARIN SODIUM 30 MG/0.3ML ~~LOC~~ SOLN
30.0000 mg | SUBCUTANEOUS | Status: DC
  Administered 2015-01-01 – 2015-01-03 (×3): 30 mg via SUBCUTANEOUS
  Filled 2014-12-31 (×3): qty 0.3

## 2014-12-31 MED ORDER — DOCUSATE SODIUM 100 MG PO CAPS
100.0000 mg | ORAL_CAPSULE | Freq: Two times a day (BID) | ORAL | Status: DC
  Filled 2014-12-31: qty 1

## 2014-12-31 MED ORDER — MORPHINE SULFATE 4 MG/ML IJ SOLN
INTRAMUSCULAR | Status: AC
  Administered 2014-12-31: 4 mg
  Filled 2014-12-31: qty 1

## 2014-12-31 MED ORDER — ACETAMINOPHEN 650 MG RE SUPP: 650.0000 mg | Freq: Four times a day (QID) | RECTAL | Status: DC | PRN

## 2014-12-31 MED ORDER — ONDANSETRON HCL 4 MG/2ML IJ SOLN: 4.0000 mg | Freq: Once | INTRAMUSCULAR | Status: AC | PRN

## 2014-12-31 MED ORDER — PSYLLIUM 95 % PO PACK
1.0000 | PACK | Freq: Two times a day (BID) | ORAL | Status: DC
  Administered 2014-12-31 – 2015-01-03 (×6): 1 via ORAL
  Filled 2014-12-31 (×10): qty 1

## 2014-12-31 MED ORDER — MIDAZOLAM HCL 2 MG/2ML IJ SOLN
INTRAMUSCULAR | Status: DC | PRN
  Administered 2014-12-31 (×2): 0.5 mg via INTRAVENOUS

## 2014-12-31 MED ORDER — POLYETHYLENE GLYCOL 3350 17 G PO PACK
17.0000 g | PACK | Freq: Every day | ORAL | Status: DC | PRN
  Filled 2014-12-31: qty 1

## 2014-12-31 MED ORDER — ONDANSETRON HCL 4 MG/2ML IJ SOLN
INTRAMUSCULAR | Status: AC
  Administered 2014-12-31: 4 mg
  Filled 2014-12-31: qty 2

## 2014-12-31 MED ORDER — DONEPEZIL HCL 5 MG PO TABS
10.0000 mg | ORAL_TABLET | Freq: Every day | ORAL | Status: DC
  Administered 2014-12-31 – 2015-01-02 (×3): 10 mg via ORAL
  Filled 2014-12-31 (×2): qty 2
  Filled 2014-12-31 (×2): qty 1
  Filled 2014-12-31: qty 2

## 2014-12-31 MED ORDER — PSYLLIUM 0.52 G PO CAPS: 2.0000 | ORAL_CAPSULE | Freq: Two times a day (BID) | ORAL | Status: DC

## 2014-12-31 MED ORDER — METOCLOPRAMIDE HCL 5 MG/ML IJ SOLN: 5.0000 mg | Freq: Three times a day (TID) | INTRAMUSCULAR | Status: DC | PRN

## 2014-12-31 MED ORDER — KCL IN DEXTROSE-NACL 20-5-0.9 MEQ/L-%-% IV SOLN
INTRAVENOUS | Status: DC
  Administered 2014-12-31 – 2015-01-03 (×3): via INTRAVENOUS
  Filled 2014-12-31 (×8): qty 1000

## 2014-12-31 MED ORDER — LACTATED RINGERS IV SOLN
INTRAVENOUS | Status: DC | PRN
  Administered 2014-12-31: 11:00:00 via INTRAVENOUS

## 2014-12-31 MED ORDER — CEFAZOLIN SODIUM 1-5 GM-% IV SOLN
INTRAVENOUS | Status: DC | PRN
  Administered 2014-12-31: 1 g via INTRAVENOUS

## 2014-12-31 MED ORDER — BUPIVACAINE HCL (PF) 0.5 % IJ SOLN: INTRAMUSCULAR | Status: DC | PRN

## 2014-12-31 MED ORDER — PHENYLEPHRINE HCL 10 MG/ML IJ SOLN
INTRAMUSCULAR | Status: DC | PRN
  Administered 2014-12-31: 60 ug via INTRAVENOUS
  Administered 2014-12-31: 200 ug via INTRAVENOUS

## 2014-12-31 MED ORDER — PRESERVISION AREDS 2 PO CAPS: 1.0000 | ORAL_CAPSULE | Freq: Every day | ORAL | Status: DC

## 2014-12-31 MED ORDER — HEPARIN SODIUM (PORCINE) 5000 UNIT/ML IJ SOLN: 5000.0000 [IU] | Freq: Three times a day (TID) | INTRAMUSCULAR | Status: DC

## 2014-12-31 MED ORDER — LEVOTHYROXINE SODIUM 25 MCG PO TABS
25.0000 ug | ORAL_TABLET | Freq: Every day | ORAL | Status: DC
  Administered 2015-01-01 – 2015-01-03 (×3): 25 ug via ORAL
  Filled 2014-12-31 (×3): qty 1

## 2014-12-31 SURGICAL SUPPLY — 43 items
3.5MMSOLID LOCK HEX TIP ×3 IMPLANT
BIOMET BALLNOSE GUIDEWIRE ×3 IMPLANT
BIOMET DISTAL GRADUATED DRILL SHORT ×3 IMPLANT
BIT DRILL 4.3MMS DISTAL GRDTED (BIT) ×1 IMPLANT
CANISTER SUCT 1200ML W/VALVE (MISCELLANEOUS) ×3 IMPLANT
DRAPE C-ARMOR (DRAPES) ×3 IMPLANT
DRAPE SHEET LG 3/4 BI-LAMINATE (DRAPES) ×3 IMPLANT
DRAPE TABLE BACK 80X90 (DRAPES) ×3 IMPLANT
DRILL 4.3MMS DISTAL GRADUATED (BIT) ×3
DRSG DERMACEA 8X12 NADH (GAUZE/BANDAGES/DRESSINGS) IMPLANT
DURAPREP 26ML APPLICATOR (WOUND CARE) ×3 IMPLANT
GAUZE SPONGE 4X4 12PLY STRL (GAUZE/BANDAGES/DRESSINGS) ×3 IMPLANT
GLOVE BIOGEL M STRL SZ7.5 (GLOVE) ×3 IMPLANT
GLOVE INDICATOR 8.0 STRL GRN (GLOVE) ×3 IMPLANT
GOWN STRL REUS W/ TWL LRG LVL3 (GOWN DISPOSABLE) IMPLANT
GOWN STRL REUS W/ TWL LRG LVL4 (GOWN DISPOSABLE) ×1 IMPLANT
GOWN STRL REUS W/TWL LRG LVL3 (GOWN DISPOSABLE)
GOWN STRL REUS W/TWL LRG LVL4 (GOWN DISPOSABLE) ×2 IMPLANT
GOWN STRL REUS W/TWL XL LVL4 (GOWN DISPOSABLE) ×3 IMPLANT
GUIDEPIN VERSANAIL DSP 3.2X444 ×3 IMPLANT
GUIDEWIRE BALL NOSE 80CM (WIRE) ×3 IMPLANT
MAT BLUE FLOOR 46X72 FLO (MISCELLANEOUS) ×3 IMPLANT
NAIL HIP FRACTURE 11X380MM (Nail) ×3 IMPLANT
NS IRRIG 1000ML POUR BTL (IV SOLUTION) ×3 IMPLANT
PACK HIP COMPR (MISCELLANEOUS) ×3 IMPLANT
PAD GROUND ADULT SPLIT (MISCELLANEOUS) ×3 IMPLANT
REAMER ROD DEEP FLUTE 2.5X950 (INSTRUMENTS) IMPLANT
SCREW BONE CORTICAL 5.0X44 (Screw) ×3 IMPLANT
SCREW LAG HIP NAIL 10.5X95 (Screw) ×3 IMPLANT
SCREWDRIVER HEX TIP 3.5MM (MISCELLANEOUS) ×3 IMPLANT
SOL PREP PVP 2OZ (MISCELLANEOUS) ×3
SOLUTION PREP PVP 2OZ (MISCELLANEOUS) ×1 IMPLANT
STAPLER SKIN PROX 35W (STAPLE) ×3 IMPLANT
STRAP SAFETY BODY (MISCELLANEOUS) ×3 IMPLANT
SUCTION FRAZIER TIP 10 FR DISP (SUCTIONS) ×3 IMPLANT
SUT VIC AB 0 CT1 36 (SUTURE) ×3 IMPLANT
SUT VIC AB 1 CT1 36 (SUTURE) ×3 IMPLANT
SUT VIC AB 2-0 CT1 27 (SUTURE) ×2
SUT VIC AB 2-0 CT1 TAPERPNT 27 (SUTURE) ×1 IMPLANT
VERSANAIL THREADED GUIDE PIN ×3 IMPLANT
affixus cortical bone screw ×3 IMPLANT
affixus hip fracture nail ×2 IMPLANT
affixus hip fracture nail lag screw ×2 IMPLANT

## 2014-12-31 NOTE — Anesthesia Postprocedure Evaluation (Signed)
  Anesthesia Post-op Note  Patient: Madison HoffMildred Wallace  Procedure(s) Performed: Procedure(s): COMPRESSION HIP (Right)  Anesthesia type:Spinal  Patient location: PACU  Post pain: Pain level controlled  Post assessment: Post-op Vital signs reviewed, Patient's Cardiovascular Status Stable, Respiratory Function Stable, Patent Airway and No signs of Nausea or vomiting  Post vital signs: Reviewed and stable  Last Vitals:  Filed Vitals:   12/31/14 1427  BP: 134/52  Pulse: 51  Temp: 36.8 C  Resp: 16    Level of consciousness: awake, alert  and patient cooperative  Complications: No apparent anesthesia complications

## 2014-12-31 NOTE — Anesthesia Preprocedure Evaluation (Signed)
Anesthesia Evaluation  Patient identified by MRN, date of birth, ID band Patient awake    Reviewed: Allergy & Precautions, NPO status , Patient's Chart, lab work & pertinent test results  History of Anesthesia Complications Negative for: history of anesthetic complications  Airway Mallampati: III  TM Distance: >3 FB Neck ROM: Full    Dental  (+) Upper Dentures, Lower Dentures   Pulmonary neg pulmonary ROS,  breath sounds clear to auscultation  Pulmonary exam normal       Cardiovascular Exercise Tolerance: Poor negative cardio ROS Normal cardiovascular examRhythm:Regular Rate:Normal     Neuro/Psych PSYCHIATRIC DISORDERS Anxiety dementia    GI/Hepatic negative GI ROS, Neg liver ROS,   Endo/Other  Hypothyroidism   Renal/GU negative Renal ROS  negative genitourinary   Musculoskeletal negative musculoskeletal ROS (+)   Abdominal   Peds negative pediatric ROS (+)  Hematology negative hematology ROS (+)   Anesthesia Other Findings   Reproductive/Obstetrics negative OB ROS                             Anesthesia Physical Anesthesia Plan  ASA: III  Anesthesia Plan: Spinal   Post-op Pain Management:    Induction:   Airway Management Planned: Nasal Cannula and Simple Face Mask  Additional Equipment:   Intra-op Plan:   Post-operative Plan:   Informed Consent: I have reviewed the patients History and Physical, chart, labs and discussed the procedure including the risks, benefits and alternatives for the proposed anesthesia with the patient or authorized representative who has indicated his/her understanding and acceptance.     Plan Discussed with: CRNA and Surgeon  Anesthesia Plan Comments:         Anesthesia Quick Evaluation

## 2014-12-31 NOTE — Transfer of Care (Signed)
Immediate Anesthesia Transfer of Care Note  Patient: Madison Wallace  Procedure(s) Performed: Procedure(s): COMPRESSION HIP (Right)  Patient Location: PACU  Anesthesia Type:Spinal  Level of Consciousness: awake and alert   Airway & Oxygen Therapy: Patient Spontanous Breathing  Post-op Assessment: Report given to RN  Post vital signs: Reviewed and stable  Last Vitals:  Filed Vitals:   12/31/14 0905  BP: 134/55  Pulse: 61  Temp: 36.7 C  Resp: 18    Complications: No apparent anesthesia complications

## 2014-12-31 NOTE — Op Note (Signed)
12/31/2014  12:46 PM  Patient:   Madison HoffMildred Wallace  Pre-Op Diagnosis:   Two-part intertrochanteric fracture right hip.  Post-Op Diagnosis:   Same  Procedure:   Reduction and internal fixation of right hip fracture with Biomet Affyxis TFN nail.  Surgeon:   Maryagnes AmosJ. Jeffrey Poggi, MD  Assistant:   None  Anesthesia:   Spinal  Findings:   As above  Complications:   None  EBL:   75 cc  Fluids:   700 cc crystalloid  UOP:   200 cc  TT:   None  Drains:   None  Closure:   Staples  Implants:   Biomet Affyxis 11 x 380 mm TFN with 95 mm lag screw and 44 mm distal interlocking screw  Brief Clinical Note:   The patient is a 81 year old pleasantly demented female who lives in an assisted living care facility. Apparently, she fell while getting up last night to go to the bathroom and injured her right hip. X-rays in the emergency room demonstrated an intertrochanteric fracture of the right hip. She has been cleared medically and presents at this time for reduction and internal fixation of the right hip fracture.  Procedure:   The patient was brought into the operating room. After adequate spinal anesthesia was obtained, she was laying in the supine position on the fracture table. The left leg was placed in a flexed and abducted position while the right lower extremity was placed in longitudinal traction. The fracture was reduced using longitudinal traction and internal rotation. The adequacy of reduction was verified fluoroscopically in AP and lateral projections and found to be near anatomic. The lateral aspects of the right hip and thigh were prepped with ChloraPrep solution before being draped sterilely. Preoperative antibiotics were administered. The greater trochanter was identified fluoroscopically and an incision and approximately 3 cm incision made approximately 2-3 fingerbreadths above the tip of the lesser trochanter. The incision was carried down through the subcutaneous tissues to expose the  gluteal fascia. This was split the length of the incision, providing access to the tip of the trochanter. Under fluoroscopic guidance, a guidewire was drilled through the tip of the trochanter into the proximal metaphysis to the level of the lesser trochanter. After verifying its position fluoroscopically in AP and lateral projections, it was overreamed with the initial reamer to the depth of the lesser trochanter. In addition, the length of the guidewire within the canal was measured and found to be 380 mm. A guidewire was passed down through the femoral canal to the supracondylar region. The adequacy of guidewire position was verified fluoroscopically in AP and lateral projections. It was overreamed sequentially using the flexible reamers, beginning with a 9 mm reamer and progressing to a 13 mm reamer. This provided good cortical chatter. The 11 x 380 mm Biomet Affyxis TFN rod was selected and advanced to the appropriate depth, as verified fluoroscopically. The guide system for the lag screw was positioned and advanced through an approximately 2 cm stab incision over the lateral aspect of the proximal femur. The guidewire was drilled up through the trochanteric femoral nail and into the femoral neck to rest within 5 mm of subchondral bone. This guidewire was measured and found to be optimally replicated by a 95 mm lag screw. The guidewire was overreamed to the appropriate depth for the lag screw was inserted and advanced to the appropriate depth as verified fluoroscopically in AP and lateral projections. The locking screw was advanced then backed off a quarter turn to  set the lag screw. Again the adequacy of hardware position and fracture reduction was verified fluoroscopically in AP and lateral projections and found to be excellent.  Attention was directed distally. Using the "perfect circle" technique, the leg and fluoroscopy machine were positioned appropriately. An approximate 1.5 cm stab incision was made  over the skin at the appropriate point before the drill bit was advanced through the cortex and across the static hole of the nail. The appropriate length of the screw was determined before the 44 mm distal interlocking screw was positioned, then advanced and tightened securely. Again the adequacy of screw position was verified fluoroscopically in AP and lateral projections and found to be excellent.  The wounds were irrigated thoroughly with sterile saline solution before the deeper subcutaneous tissues were closed using 2-0 Vicryl interrupted sutures. The skin was closed using staples. Sterile bulky dressings were applied to all wounds before the patient was transferred back to her hospital bed. She was then brought to the recovery room in satisfactory condition after tolerating the procedure well.

## 2014-12-31 NOTE — Progress Notes (Signed)
Notified daughter that patient was moved to a room closer to nurses station.  Advanced diet as patient is tolerating clear liquids.

## 2014-12-31 NOTE — ED Provider Notes (Signed)
[facility] Emergency Department Provider Note  ____________________________________________  Time seen: Approximately 2:13 AM  I have reviewed the triage vital signs and the nursing notes.  The patient arrived by EMS.  HISTORY  Chief Complaint Fall  The patient has chronic dementia and lives in a nursing home.  HPI Thomas HoffMildred Deist is a 81 y.o. female with a history of dementia who presents from her nursing home with acute right hip pain. She reportedly had an unwitnessed fall while walking to the bathroom. She is disoriented but apparently is more or less at her baseline. However she is complaining of severe sharp pain in her right hip but demanding to be allowed up. Her right leg is shortened and externally rotated. She remembers falling all walking to the bathroom but cannot provide other details about the incident. She denies any other pain at this time.   Past Medical History  Diagnosis Date  . Dementia   . Generalized muscle weakness   . Diverticulosis   . Anxiety   . Hypothyroidism     There are no active problems to display for this patient.   History reviewed. No pertinent past surgical history.  No current outpatient prescriptions on file.  Allergies Penicillins  History reviewed. No pertinent family history.  Social History History  Substance Use Topics  . Smoking status: Unknown If Ever Smoked  . Smokeless tobacco: Not on file  . Alcohol Use: No    Review of Systems Unable to obtain secondary to dementia. At this time the patient only endorses a fall and the sharp pain in her right hip.  ____________________________________________   PHYSICAL EXAM:  VITAL SIGNS: ED Triage Vitals  Enc Vitals Group     BP 12/31/14 0214 159/85 mmHg     Pulse Rate 12/31/14 0214 53     Resp 12/31/14 0214 18     Temp 12/31/14 0214 98 F (36.7 C)     Temp Source 12/31/14 0214 Oral     SpO2 12/31/14 0214 99 %     Weight 12/31/14 0214 120  lb (54.432 kg)     Height 12/31/14 0214 5\' 5"  (1.651 m)     Head Cir --      Peak Flow --      Pain Score --      Pain Loc --      Pain Edu? --      Excl. in GC? --      Constitutional: Awake, anxious, in distress. Oriented only to self and complaining of pain. Eyes: Conjunctivae are normal. PERRL. EOMI. Head: Atraumatic. Nose: No congestion/rhinnorhea. Mouth/Throat: Mucous membranes are moist.  Oropharynx non-erythematous. Neck: No stridor.  No cervical spine tenderness to palpation. Cardiovascular: Normal rate, regular rhythm. Grossly normal heart sounds.  Good peripheral circulation. Respiratory: Normal respiratory effort.  No retractions. Lungs CTAB. Gastrointestinal: Soft and nontender. No distention. No abdominal bruits. No CVA tenderness. Musculoskeletal: Shortened and externally rotated right lower extremity. Severe tenderness to palpation of right hip. No lacerations. Warm distally with normal capillary refill. No appreciable vascular differences between left and right extremity. No other injuries are evident. Neurologic:  Normal speech and language. Gait not assessed due to probable right hip injury. Skin:  Skin is warm, dry and intact. No rash noted. Psychiatric: Anxious, oriented only to self.   ____________________________________________   LABS (all labs ordered are listed, but only abnormal results are displayed)  Labs Reviewed  CBC WITH DIFFERENTIAL/PLATELET - Abnormal; Notable for the following:  RDW 14.9 (*)    All other components within normal limits  PROTIME-INR  APTT  COMPREHENSIVE METABOLIC PANEL  TROPONIN I  URINALYSIS, ROUTINE W REFLEX MICROSCOPIC  TYPE AND SCREEN  ABO/RH   ____________________________________________  EKG  ED ECG REPORT   Date: 12/31/2014  EKG Time: 2:29  Rate: 59  Rhythm: sinus bradycardia, 1st degree AV block  Axis: Normal  Intervals:first-degree A-V block   ST&T Change: No acute ST or T-wave  changes  ____________________________________________  RADIOLOGY  Dg Hip Port Unilat With Pelvis 1v Right  12/31/2014   CLINICAL DATA:  [place]at[place]ell at home.  Initial encounter.  EXAM: RIGHT HIP (WITH PELVIS) 1 VIEW PORTABLE  COMPARISON:  None.  FINDINGS: There is an intertrochanteric right hip fracture with varus angulation. There is no dislocation. No bone lesion or bony destruction is evident.  IMPRESSION: Intertrochanteric right hip fracture   Electronically Signed   By: Ellery Plunkaniel R Mitchell M.D.   On: 12/31/2014 03:16    ____________________________________________   PROCEDURES  Procedure(s) performed: None  Critical Care performed: No  ____________________________________________   INITIAL IMPRESSION / ASSESSMENT AND PLAN / ED COURSE  Pertinent labs & imaging results that were available during my care of the patient were reviewed by me and considered in my medical decision making (see chart for details).  Probable hip/pelvis fracture. The patient is acute distress so we'll provide morphine 4 mg IV and Zofran 4 mg IV. I will keep patient nothing by mouth at this time. I will obtain the standard preoperative laboratory and radiographic workup assuming that the patient will need orthopedic assistance for her probable fracture.  ----------------------------------------- 3:22 AM on 12/31/2014 -----------------------------------------  The patient's pain is better controlled at this time. I have paged Dr. Joice LoftsPoggi for a consult given her intertrochanteric femur fracture.  ----------------------------------------- 3:34 AM on 12/31/2014 -----------------------------------------  I informed Dr. Joice LoftsPoggi of the patient by phone and he recommended Buck's traction with 5 pounds of weight if possible. I spoke with Dr. Sheryle Haildiamond who will admit. ____________________________________________   FINAL CLINICAL IMPRESSION(S) / ED DIAGNOSES  Final diagnoses:  Fall at home, initial encounter  Fracture,  intertrochanteric, right femur, closed, initial encounter  Dementia, without behavioral disturbance     Loleta Roseory Nkechi Linehan, MD 12/31/14 [phone]

## 2014-12-31 NOTE — H&P (Signed)
Madison Wallace is an 81 y.o. female.   Chief Complaint: Inability to walk HPI: The patient presents to the emergency department via EMS after she was found on her bedroom floor unable to get up. Presumably she fell on the way to the bathroom at her assisted living facility. Her leg was visibly rotated and shortened upon arrival. The patient has not complained of any pain. She denies shortness of breath nausea vomiting or diarrhea. In the emergency department the patient underwent x-ray of her right extremity which showed intertrochanteric fracture of the right femur which prompted emergency depart and staff to call for admission.  Past Medical History  Diagnosis Date  . Dementia   . Generalized muscle weakness   . Diverticulosis   . Anxiety   . Hypothyroidism     History reviewed. No pertinent past surgical history.  History reviewed. No pertinent family history. Social History:  reports that she does not drink alcohol. Her tobacco and drug histories are not on file.  Allergies:  Allergies  Allergen Reactions  . Penicillins Other (See Comments)    Unknown reaction    Medications Prior to Admission  Medication Sig Dispense Refill  . acetaminophen (TYLENOL) 500 MG tablet Take 500 mg by mouth at bedtime.    Marland Kitchen acetaminophen (TYLENOL) 500 MG tablet Take 500 mg by mouth every 6 (six) hours as needed for mild pain, moderate pain or fever.    . busPIRone (BUSPAR) 15 MG tablet Take 15 mg by mouth 3 (three) times daily.    Marland Kitchen donepezil (ARICEPT) 10 MG tablet Take 10 mg by mouth at bedtime.    . hydrocortisone 2.5 % cream Apply 1 application topically 2 (two) times daily. Apply 1g topically twice daily to back until resolved.    . levothyroxine (SYNTHROID, LEVOTHROID) 25 MCG tablet Take 25 mcg by mouth daily.    Marland Kitchen LORazepam (ATIVAN) 0.5 MG tablet Take 0.25 mg by mouth at bedtime.    Marland Kitchen LORazepam (ATIVAN) 0.5 MG tablet Take 0.25 mg by mouth 2 (two) times daily as needed for anxiety.    .  Multiple Vitamins-Minerals (PRESERVISION AREDS 2) CAPS Take 1 capsule by mouth daily.    Marland Kitchen nystatin cream (MYCOSTATIN) Apply 1 application topically 2 (two) times daily.    . pantoprazole (PROTONIX) 40 MG tablet Take 40 mg by mouth daily.    . polyethylene glycol (MIRALAX / GLYCOLAX) packet Take 17 g by mouth daily as needed (for constipation). Fill cap to 17gm mark, mix with 4-8 ounces of fluid and take by mouth every day as needed for constipation    . Pramoxine-Calamine 1-3 % LOTN Apply 1 application topically 2 (two) times daily as needed (for dry skin or itching.).    Marland Kitchen psyllium (REGULOID) 0.52 G capsule Take 2 capsules by mouth 2 (two) times daily.    . sertraline (ZOLOFT) 100 MG tablet Take 100 mg by mouth daily.    Marland Kitchen triamcinolone cream (KENALOG) 0.1 % Apply 1 application topically 2 (two) times daily. Apply a thin layer to affected area(s) to back of right hand topically twice daily.    . vitamin B-12 (CYANOCOBALAMIN) 1000 MCG tablet Take 1,000 mcg by mouth daily.      Results for orders placed or performed during the hospital encounter of 12/31/14 (from the past 48 hour(s))  Comprehensive metabolic panel     Status: Abnormal   Collection Time: 12/31/14  2:20 AM  Result Value Ref Range   Sodium 137 135 - 145 mmol/L  Potassium 3.8 3.5 - 5.1 mmol/L   Chloride 99 (L) 101 - 111 mmol/L   CO2 30 22 - 32 mmol/L   Glucose, Bld 107 (H) 65 - 99 mg/dL   BUN 15 6 - 20 mg/dL   Creatinine, Ser 0.74 0.44 - 1.00 mg/dL   Calcium 9.1 8.9 - 10.3 mg/dL   Total Protein 7.4 6.5 - 8.1 g/dL   Albumin 4.0 3.5 - 5.0 g/dL   AST 21 15 - 41 U/L   ALT 18 14 - 54 U/L   Alkaline Phosphatase 53 38 - 126 U/L   Total Bilirubin 0.5 0.3 - 1.2 mg/dL   GFR calc non Af Amer >60 >60 mL/min   GFR calc Af Amer >60 >60 mL/min    Comment: (NOTE) The eGFR has been calculated using the CKD EPI equation. This calculation has not been validated in all clinical situations. eGFR's persistently <60 mL/min signify possible  Chronic Kidney Disease.    Anion gap 8 5 - 15  CBC with Differential     Status: Abnormal   Collection Time: 12/31/14  2:20 AM  Result Value Ref Range   WBC 6.3 3.6 - 11.0 K/uL   RBC 4.24 3.80 - 5.20 MIL/uL   Hemoglobin 13.1 12.0 - 16.0 g/dL   HCT 39.4 35.0 - 47.0 %   MCV 92.9 80.0 - 100.0 fL   MCH 30.8 26.0 - 34.0 pg   MCHC 33.2 32.0 - 36.0 g/dL   RDW 14.9 (H) 11.5 - 14.5 %   Platelets 219 150 - 440 K/uL   Neutrophils Relative % 62 %   Neutro Abs 3.9 1.4 - 6.5 K/uL   Lymphocytes Relative 23 %   Lymphs Abs 1.5 1.0 - 3.6 K/uL   Monocytes Relative 10 %   Monocytes Absolute 0.6 0.2 - 0.9 K/uL   Eosinophils Relative 4 %   Eosinophils Absolute 0.3 0 - 0.7 K/uL   Basophils Relative 1 %   Basophils Absolute 0.1 0 - 0.1 K/uL  Type and screen     Status: None   Collection Time: 12/31/14  2:20 AM  Result Value Ref Range   ABO/RH(D) A POS    Antibody Screen NEG    Sample Expiration 01/03/2015   Protime-INR     Status: None   Collection Time: 12/31/14  2:20 AM  Result Value Ref Range   Prothrombin Time 12.7 11.4 - 15.0 seconds   INR 0.93   APTT     Status: None   Collection Time: 12/31/14  2:20 AM  Result Value Ref Range   aPTT 26 24 - 36 seconds  Troponin I     Status: None   Collection Time: 12/31/14  2:20 AM  Result Value Ref Range   Troponin I <0.03 <0.031 ng/mL    Comment:        NO INDICATION OF MYOCARDIAL INJURY.   Urinalysis complete, with microscopic [facility])     Status: Abnormal   Collection Time: 12/31/14  2:40 AM  Result Value Ref Range   Color, Urine YELLOW (A) YELLOW   APPearance HAZY (A) CLEAR   Glucose, UA NEGATIVE NEGATIVE mg/dL   Bilirubin Urine NEGATIVE NEGATIVE   Ketones, ur NEGATIVE NEGATIVE mg/dL   Specific Gravity, Urine 1.008 1.005 - 1.030   Hgb urine dipstick 1+ (A) NEGATIVE   pH 7.0 5.0 - 8.0   Protein, ur NEGATIVE NEGATIVE mg/dL   Nitrite NEGATIVE NEGATIVE   Leukocytes, UA 3+ (A) NEGATIVE   RBC /  HPF 6-30 0 - 5 RBC/hpf   WBC, UA TOO  NUMEROUS TO COUNT 0 - 5 WBC/hpf   Bacteria, UA RARE (A) NONE SEEN   Squamous Epithelial / LPF 0-5 (A) NONE SEEN   WBC Clumps PRESENT    Mucous PRESENT   CBC     Status: Abnormal   Collection Time: 12/31/14  6:47 AM  Result Value Ref Range   WBC 10.0 3.6 - 11.0 K/uL   RBC 3.98 3.80 - 5.20 MIL/uL   Hemoglobin 12.1 12.0 - 16.0 g/dL   HCT 37.0 35.0 - 47.0 %   MCV 92.8 80.0 - 100.0 fL   MCH 30.5 26.0 - 34.0 pg   MCHC 32.8 32.0 - 36.0 g/dL   RDW 14.6 (H) 11.5 - 14.5 %   Platelets 201 150 - 440 K/uL  Creatinine, serum     Status: None   Collection Time: 12/31/14  6:47 AM  Result Value Ref Range   Creatinine, Ser 0.65 0.44 - 1.00 mg/dL   GFR calc non Af Amer >60 >60 mL/min   GFR calc Af Amer >60 >60 mL/min    Comment: (NOTE) The eGFR has been calculated using the CKD EPI equation. This calculation has not been validated in all clinical situations. eGFR's persistently <60 mL/min signify possible Chronic Kidney Disease.    Dg Chest Portable 1 View  12/31/2014   CLINICAL DATA:  Preoperative  EXAM: PORTABLE CHEST - 1 VIEW  COMPARISON:  01/06/2013  FINDINGS: There is marked unchanged cardiomegaly and mild aortic tortuosity. The lungs are grossly clear. There is no large effusion. Pulmonary vasculature is normal.  IMPRESSION: Cardiomegaly.  No acute findings.   Electronically Signed   By: Andreas Newport M.D.   On: 12/31/2014 03:35   Dg Hip Port Unilat With Pelvis 1v Right  12/31/2014   CLINICAL DATA:  [place] at home.  Initial encounter.  EXAM: RIGHT HIP (WITH PELVIS) 1 VIEW PORTABLE  COMPARISON:  None.  FINDINGS: There is an intertrochanteric right hip fracture with varus angulation. There is no dislocation. No bone lesion or bony destruction is evident.  IMPRESSION: Intertrochanteric right hip fracture   Electronically Signed   By: Andreas Newport M.D.   On: 12/31/2014 03:16    Review of Systems  Constitutional: Negative for fever and chills.  HENT: Negative for sore throat and tinnitus.     Eyes: Negative for blurred vision and redness.  Respiratory: Negative for cough and shortness of breath.   Cardiovascular: Negative for chest pain, palpitations, orthopnea and PND.  Gastrointestinal: Negative for nausea, vomiting, abdominal pain and diarrhea.  Genitourinary: Negative for dysuria, urgency and frequency.  Musculoskeletal: Negative for myalgias and joint pain.  Skin: Negative for rash.       No lesions  Neurological: Negative for speech change, focal weakness and weakness.  Endo/Heme/Allergies: Does not bruise/bleed easily.       No temperature intolerance  Psychiatric/Behavioral: Positive for memory loss. Negative for depression and suicidal ideas.   review of systems performed but largely unreliable as the patient has significant memory loss. Notably she denies pain anywhere  Blood pressure 153/55, pulse 73, temperature 97.7 F (36.5 C), temperature source Oral, resp. rate 18, height 5' 5"  (1.651 m), weight 53.4 kg (117 lb 11.6 oz), SpO2 95 %. Physical Exam  Vitals reviewed. Constitutional: She appears well-developed and well-nourished.  HENT:  Head: Normocephalic and atraumatic.  Eyes: EOM are normal. Pupils are equal, round, and reactive to light.  Neck: Normal range of  motion. No tracheal deviation present. No thyromegaly present.  Cardiovascular: Normal rate, regular rhythm and normal heart sounds.  Exam reveals no gallop and no friction rub.   No murmur heard. Respiratory: Effort normal and breath sounds normal.  GI: Soft. Bowel sounds are normal. She exhibits no distension. There is no tenderness.  Musculoskeletal: She exhibits no edema.       Right hip: She exhibits decreased range of motion.  Shortened and externally rotated  Lymphadenopathy:    She has no cervical adenopathy.  Neurological: She is alert. No cranial nerve deficit. She exhibits normal muscle tone.  Skin: Skin is warm and dry. Lesion and rash noted.     Psychiatric: She has a normal mood  and affect. Judgment and thought content normal.     Assessment/Plan This is a 81 year old female admitted for intertrochanteric fracture of the right femur. 1. Intertrochanteric fracture: The patient's pain is well managed although this is likely secondary to her dementia and decreased awareness of her surroundings and situation. Orthopedic surgery has been consulted. The patient is nothing by mouth and will be placed in traction. A Foley has been placed in preparation for surgery. 2. Decubitus wound/rash: The patient was not rolled during physical exam due to her hip fracture. This is been a chronic problem and the patient is on an alternating regimen of steroid ointments as well as nystatin cream. When the patient's fractured bone is stabilized we will examine her wound and dress it appropriately. 3. Hypothyroidism: Continue Synthroid 4. Depression/anxiety: Continue buspirone and sertraline and anxiolytics as needed  5. Dementia: Alzheimer's type; stable. Continue Aricept 6. DVT prophylaxis: Heparin 7. GI prophylaxis: None The patient is a DO NOT RESUSCITATE/she does not want resuscitative measures in the event of cardiopulmonary collapse. Time spent on admission orders and patient care possibly 45 minutes.  Harrie Foreman 12/31/2014, 7:51 AM

## 2014-12-31 NOTE — ED Notes (Signed)
[facility]. Per EMS: Pt had unwitnessed fall walking to bathroom.  constant Sharp pain  in right leg location.   Pt is oriented only self. No acute breathing distress noted.

## 2014-12-31 NOTE — OR Nursing (Signed)
Patient spinal level at L1

## 2014-12-31 NOTE — Anesthesia Procedure Notes (Signed)
Spinal Patient location during procedure: OR Start time: 12/31/2014 11:13 AM End time: 12/31/2014 11:16 AM Staffing Anesthesiologist: Forestine ChutePOLIN, CARRIE Performed by: anesthesiologist  Preanesthetic Checklist Completed: patient identified, site marked, surgical consent, pre-op evaluation, timeout performed, IV checked, risks and benefits discussed and monitors and equipment checked Spinal Block Patient position: right lateral decubitus Prep: ChloraPrep Patient monitoring: heart rate, continuous pulse ox and blood pressure Approach: midline Location: L3-4 Injection technique: single-shot Needle Needle type: [place]  Needle gauge: 24 G Needle length: 5 cm Assessment Sensory level: T10 Additional Notes Pt tolerated procedure well. No immediate complications

## 2014-12-31 NOTE — Consult Note (Signed)
ORTHOPAEDIC CONSULTATION  REQUESTING PHYSICIAN: Auburn BilberryShreyang Patel, MD  Chief Complaint:  Right hip pain  History of Present Illness: Madison HoffMildred Wallace is a 81 y.o. female resident of an assisted living facility who recently has been moved to the Alzheimer unit due to progressive dementia. She apparently was in her usual state of health but was found on the floor last night. Apparently, she tried to get out of bed and walk to the bathroom but fell and injured her right hip. She was brought to the emergency room where x-rays demonstrated a two-part intertrochanteric fracture of the right hip. She has been admitted to the medicine service in preparation for definitive management of her injury. The patient does not appear to have sustained any other injuries. There is no evidence that she had any loss of consciousness. She cannot state whether she had any difficulties with chest pain, lightheadedness, shortness breath, or other problems and may have precipitated her fall.  Past Medical History  Diagnosis Date  . Dementia   . Generalized muscle weakness   . Diverticulosis   . Anxiety   . Hypothyroidism    History reviewed. No pertinent past surgical history. History   Social History  . Marital Status: Widowed    Spouse Name: N/A  . Number of Children: N/A  . Years of Education: N/A   Social History Main Topics  . Smoking status: Unknown If Ever Smoked  . Smokeless tobacco: Not on file  . Alcohol Use: No  . Drug Use: Not on file  . Sexual Activity: Not on file   Other Topics Concern  . None   Social History Narrative  . None   History reviewed. No pertinent family history. Allergies  Allergen Reactions  . Penicillins Other (See Comments)    Unknown reaction   Prior to Admission medications   Medication Sig Start Date End Date Taking? Authorizing Provider  acetaminophen (TYLENOL) 500 MG tablet Take 500 mg by mouth at  bedtime.   Yes Historical Provider, MD  acetaminophen (TYLENOL) 500 MG tablet Take 500 mg by mouth every 6 (six) hours as needed for mild pain, moderate pain or fever.   Yes Historical Provider, MD  busPIRone (BUSPAR) 15 MG tablet Take 15 mg by mouth 3 (three) times daily.   Yes Historical Provider, MD  donepezil (ARICEPT) 10 MG tablet Take 10 mg by mouth at bedtime.   Yes Historical Provider, MD  hydrocortisone 2.5 % cream Apply 1 application topically 2 (two) times daily. Apply 1g topically twice daily to back until resolved.   Yes Historical Provider, MD  levothyroxine (SYNTHROID, LEVOTHROID) 25 MCG tablet Take 25 mcg by mouth daily.   Yes Historical Provider, MD  LORazepam (ATIVAN) 0.5 MG tablet Take 0.25 mg by mouth at bedtime.   Yes Historical Provider, MD  LORazepam (ATIVAN) 0.5 MG tablet Take 0.25 mg by mouth 2 (two) times daily as needed for anxiety.   Yes Historical Provider, MD  Multiple Vitamins-Minerals (PRESERVISION AREDS 2) CAPS Take 1 capsule by mouth daily.   Yes Historical Provider, MD  nystatin cream (MYCOSTATIN) Apply 1 application topically 2 (two) times daily.   Yes Historical Provider, MD  pantoprazole (PROTONIX) 40 MG tablet Take 40 mg by mouth daily.   Yes Historical Provider, MD  polyethylene glycol (MIRALAX / GLYCOLAX) packet Take 17 g by mouth daily as needed (for constipation). Fill cap to 17gm mark, mix with 4-8 ounces of fluid and take by mouth every day as needed for constipation  Yes Historical Provider, MD  Pramoxine-Calamine 1-3 % LOTN Apply 1 application topically 2 (two) times daily as needed (for dry skin or itching.).   Yes Historical Provider, MD  psyllium (REGULOID) 0.52 G capsule Take 2 capsules by mouth 2 (two) times daily.   Yes Historical Provider, MD  sertraline (ZOLOFT) 100 MG tablet Take 100 mg by mouth daily.   Yes Historical Provider, MD  triamcinolone cream (KENALOG) 0.1 % Apply 1 application topically 2 (two) times daily. Apply a thin layer to  affected area(s) to back of right hand topically twice daily.   Yes Historical Provider, MD  vitamin B-12 (CYANOCOBALAMIN) 1000 MCG tablet Take 1,000 mcg by mouth daily.   Yes Historical Provider, MD   Dg Chest Portable 1 View  12/31/2014   CLINICAL DATA:  Preoperative  EXAM: PORTABLE CHEST - 1 VIEW  COMPARISON:  01/06/2013  FINDINGS: There is marked unchanged cardiomegaly and mild aortic tortuosity. The lungs are grossly clear. There is no large effusion. Pulmonary vasculature is normal.  IMPRESSION: Cardiomegaly.  No acute findings.   Electronically Signed   By: Ellery Plunkaniel R Mitchell M.D.   On: 12/31/2014 03:35   Dg Hip Port Unilat With Pelvis 1v Right  12/31/2014   CLINICAL DATA:  Larey SeatFell at home.  Initial encounter.  EXAM: RIGHT HIP (WITH PELVIS) 1 VIEW PORTABLE  COMPARISON:  None.  FINDINGS: There is an intertrochanteric right hip fracture with varus angulation. There is no dislocation. No bone lesion or bony destruction is evident.  IMPRESSION: Intertrochanteric right hip fracture   Electronically Signed   By: Ellery Plunkaniel R Mitchell M.D.   On: 12/31/2014 03:16    Positive ROS: All other systems have been reviewed and were otherwise negative with the exception of those mentioned in the HPI and as above.  Physical Exam: General:  Alert, no acute distress Psychiatric:  Patient is competent for consent with normal mood and affect   Cardiovascular:  No pedal edema Respiratory:  No wheezing, non-labored breathing GI:  Abdomen is soft and non-tender Skin:  No lesions in the area of chief complaint Neurologic:  Sensation intact distally Lymphatic:  No axillary or cervical lymphadenopathy  Orthopedic Exam:  On orthopedic examination, the right lower extremity appears to be somewhat shortened and externally rotated as compared to the left. She has pain with any attempted active or passive motion of the right hip. Skin inspection around the right hip is unremarkable. She is able to actively dorsiflex and plantar  flex her toes and ankle, and has intact sensation to light touch to all distributions of her right foot. She has good capillary refill to her right foot.  X-rays:  X-rays of the pelvis and right hip are available for review. These films demonstrate a two-part varus-displaced intertrochanteric fracture of the right hip. There is a small nondisplaced fracture line extending distal to the lesser trochanter. The hip joint itself appears to be well-maintained.  Assessment: Closed displaced 2-part intertrochanteric fracture right hip.  Plan: The treatment options are discussed with the patient and her family. The procedure of reduction and internal fixation using a trochanteric femoral nail was described, as were the potential risks (including bleeding, infection, nerve and/or blood vessel injury, persistent or recurrent pain, stiffness, malunion and/or nonunion, need for further surgery, blood clots, strokes, heart attacks and/or arrhythmias, need for further surgery, etc.) and benefits. The patient's family states her understanding and wished to proceed. A consent will be obtained by the nursing staff.  Thank you for asking to  participate in the care of this most unfortunate woman. I'll be happy to follow her with you.    Maryagnes Amos, MD  Beeper #:  [phone]  12/31/2014 8:44 AM

## 2014-12-31 NOTE — Plan of Care (Signed)
Problem: Consults Goal: Diagnosis- Total Joint Replacement Primary Total Hip     

## 2015-01-01 LAB — COMPREHENSIVE METABOLIC PANEL
ALK PHOS: 38 U/L (ref 38–126)
ALT: 14 U/L (ref 14–54)
AST: 17 U/L (ref 15–41)
Albumin: 2.9 g/dL — ABNORMAL LOW (ref 3.5–5.0)
Anion gap: 6 (ref 5–15)
BILIRUBIN TOTAL: 0.6 mg/dL (ref 0.3–1.2)
BUN: 15 mg/dL (ref 6–20)
CHLORIDE: 106 mmol/L (ref 101–111)
CO2: 27 mmol/L (ref 22–32)
Calcium: 8 mg/dL — ABNORMAL LOW (ref 8.9–10.3)
Creatinine, Ser: 0.74 mg/dL (ref 0.44–1.00)
GLUCOSE: 140 mg/dL — AB (ref 65–99)
Potassium: 3.9 mmol/L (ref 3.5–5.1)
SODIUM: 139 mmol/L (ref 135–145)
Total Protein: 5.4 g/dL — ABNORMAL LOW (ref 6.5–8.1)

## 2015-01-01 LAB — CBC
HEMATOCRIT: 31.7 % — AB (ref 35.0–47.0)
HEMOGLOBIN: 10.3 g/dL — AB (ref 12.0–16.0)
MCH: 30.5 pg (ref 26.0–34.0)
MCHC: 32.5 g/dL (ref 32.0–36.0)
MCV: 93.8 fL (ref 80.0–100.0)
Platelets: 157 10*3/uL (ref 150–440)
RBC: 3.38 MIL/uL — AB (ref 3.80–5.20)
RDW: 14.8 % — ABNORMAL HIGH (ref 11.5–14.5)
WBC: 8 10*3/uL (ref 3.6–11.0)

## 2015-01-01 MED ORDER — TRAMADOL HCL 50 MG PO TABS: 50.0000 mg | ORAL_TABLET | Freq: Four times a day (QID) | ORAL | Status: DC | PRN

## 2015-01-01 MED ORDER — LORAZEPAM 0.5 MG PO TABS
0.5000 mg | ORAL_TABLET | Freq: Two times a day (BID) | ORAL | Status: DC | PRN
  Administered 2015-01-01: 0.5 mg via ORAL
  Filled 2015-01-01: qty 1

## 2015-01-01 MED ORDER — OXYCODONE HCL 5 MG PO TABS: 5.0000 mg | ORAL_TABLET | Freq: Four times a day (QID) | ORAL | Status: DC | PRN

## 2015-01-01 MED ORDER — ENOXAPARIN SODIUM 30 MG/0.3ML ~~LOC~~ SOLN: 30.0000 mg | Freq: Two times a day (BID) | SUBCUTANEOUS | Status: DC

## 2015-01-01 NOTE — Progress Notes (Signed)
Physical Therapy Treatment Patient Details Name: Madison HoffMildred Wallace MRN: [id number] DOB: [birth year] Today's Date: 01/01/2015    History of Present Illness Pt is a 81 y.o. female s/p unwitnessed fall walking to bathroom at ALF (Alzheimer Unit) sustaining an intertrochanteric R hip fracture; pt s/p R hip gamma nail fixation 12/31/14.    PT Comments    Pt requiring multiple attempts to stand and attempt gait but eventually able to progress to ambulating 4 feet with RW and mod to max assist x2 (limited d/t R hip pain).  Pt perseverating throughout session (on shoes, panties, or date) requiring consistent redirection.  Recommend pt discharge to STR when medically appropriate.  Follow Up Recommendations  SNF     Equipment Recommendations  Rolling walker with 5" wheels    Recommendations for Other Services       Precautions / Restrictions Precautions Precautions: Fall Restrictions Weight Bearing Restrictions: Yes RLE Weight Bearing: Weight bearing as tolerated    Mobility  Bed Mobility Overal bed mobility: Needs Assistance;+2 for physical assistance Bed Mobility: Sit to Supine     Sit to supine: Max assist;+2 for physical assistance   General bed mobility comments: assist for trunk and B LE's  Transfers Overall transfer level: Needs assistance Equipment used: Rolling walker (2 wheeled) Transfers: Sit to/from [employer]Stand;Stand Pivot Transfers Sit to Stand: Max assist;+2 physical assistance Stand pivot transfers: Max assist;+2 physical assistance       General transfer comment: 1st trial with RW (pt unable to maintain standing); 2nd trial with hand hold assist (pt unable to initiate steps); 3rd trial again with RW (pt with improved upright this trial)  Ambulation/Gait Ambulation/Gait assistance: Mod assist;Max assist;+2 physical assistance Ambulation Distance (Feet): 4 Feet Assistive device: Rolling walker (2 wheeled)       General Gait Details: decreased stance time R LE;  antalgic; very small steps; required simple one step commands consistently   Stairs            Wheelchair Mobility    Modified Rankin (Stroke Patients Only)       Balance Overall balance assessment: History of Falls                                  Cognition Arousal/Alertness: Awake/alert Behavior During Therapy: Anxious (Pt perseverating on needing her "shoes" and "panties" and also the date today) Overall Cognitive Status:  (h/o cognitive impairments)       Memory: Decreased recall of precautions;Decreased short-term memory              Exercises      General Comments  Pt's daughter present throughout session.      Pertinent Vitals/Pain Pain Assessment: Faces Faces Pain Scale: Hurts whole lot Pain Location: R hip Pain Descriptors / Indicators: Sharp;Shooting;Sore Pain Intervention(s): Limited activity within patient's tolerance;Monitored during session;Premedicated before session;Repositioned           Pt appearing comfortable at rest (pain only with activity and R LE movement).   Home Living Family/patient expects to be discharged to:: Skilled nursing facility Living Arrangements:  (ALF ) Available Help at Discharge:  (ALF) Type of Home: Assisted living Home Access: Level entry   Home Layout: One level Home Equipment: Walker - 4 wheels      Prior Function Level of Independence: Needs assistance  Gait / Transfers Assistance Needed: Independent with rollator in supervised setting (pt moved to Alzheimer unit d/t "wandering" issues per pt's daughter)  ADL's / Homemaking Assistance Needed: meals provided at ALF and housekeeping services provided      PT Goals (current goals can now be found in the care plan section) Acute Rehab PT Goals Patient Stated Goal: unable to verbalize PT Goal Formulation: With family Time For Goal Achievement: 01/15/15 Potential to Achieve Goals: Fair Progress towards PT goals: Progressing toward goals     Frequency  BID    PT Plan Current plan remains appropriate    Co-evaluation             End of Session Equipment Utilized During Treatment: Gait belt Activity Tolerance: Patient limited by pain Patient left: in bed;with call bell/phone within reach;with bed alarm set;with SCD's reapplied;with family/visitor present     Time: 1340-1410 PT Time Calculation (min) (ACUTE ONLY): 30 min  Charges:  $Gait Training: 8-22 mins $Therapeutic Activity: 8-22 mins                    G CodesHendricks Wallace:      Madison Wallace 01/01/2015, 2:37 PM Madison LimesEmily Lyal Husted, PT [phone]

## 2015-01-01 NOTE — Care Management Note (Signed)
Case Management Note  Patient Details  Name: Madison Wallace MRN: [id number] Date of Birth: November 16, 1916  Subjective/Objective:                  [facility] from [facility]. PT pending. Note states patient's family anticipates SNF. CSW referral in place.   Action/Plan: RNCM will continue to follow.   Expected Discharge Date:  01/03/15               Expected Discharge Plan:     In-House Referral:  Clinical Social Work  Discharge planning Services     Post Acute Care Choice:    Choice offered to:     DME Arranged:    DME Agency:     HH Arranged:    HH Agency:     Status of Service:  Completed, signed off  Medicare Important Message Given:  Yes Date Medicare IM Given:  01/01/15 Medicare IM give by:  Collie SiadAngela Slayde Brault Date Additional Medicare IM Given:    Additional Medicare Important Message give by:     If discussed at Long Length of Stay Meetings, dates discussed:    Additional Comments:  Collie Siadngela Bettyjean Stefanski, RN 01/01/2015, 11:02 AM

## 2015-01-01 NOTE — Evaluation (Signed)
Physical Therapy Evaluation Patient Details Name: Madison HoffMildred Dumas MRN: [id number] DOB: 1917-01-07 Today's Date: 01/01/2015   History of Present Illness  Pt is a 81 y.o. female s/p unwitnessed fall walking to bathroom at ALF ([facility] Unit) sustaining an intertrochanteric R hip fracture; pt s/p R hip gamma nail fixation 12/31/14.  Clinical Impression  Currently pt demonstrates impairments with pain, balance, R LE strength and ROM, and limitations with functional mobility.  Prior to admission, pt was independent (in supervised setting) with rollator.  Pt lives in Alzheimer unit at ALF.  Currently pt is max assist x2 supine to sit and transfer bed to chair.  Pt would benefit from skilled PT to address above noted impairments and functional limitations.  Recommend pt discharge to STR when medically appropriate.     Follow Up Recommendations SNF    Equipment Recommendations  Rolling walker with 5" wheels    Recommendations for Other Services       Precautions / Restrictions Precautions Precautions: Fall Restrictions Weight Bearing Restrictions: Yes RLE Weight Bearing: Weight bearing as tolerated      Mobility  Bed Mobility Overal bed mobility: Needs Assistance;+2 for physical assistance Bed Mobility: Supine to Sit     Supine to sit: Max assist;+2 for physical assistance;HOB elevated     General bed mobility comments: assist for trunk and B LE's  Transfers Overall transfer level: Needs assistance Equipment used: None Transfers: Sit to/from [employer]Stand;Stand Pivot Transfers Sit to Stand: Max assist;+2 physical assistance Stand pivot transfers: Max assist;+2 physical assistance       General transfer comment: pt able to take a couple steps to chair but limited d/t R hip pain  Ambulation/Gait             General Gait Details: Not appropriate at this time (will re-attempt next session as able)  Stairs            Wheelchair Mobility    Modified Rankin (Stroke  Patients Only)       Balance Overall balance assessment: History of Falls                                           Pertinent Vitals/Pain Pain Assessment: Faces Faces Pain Scale: Hurts whole lot (appears comfortable at rest) Pain Location: R hip Pain Descriptors / Indicators: Sharp;Shooting Pain Intervention(s): Limited activity within patient's tolerance;Monitored during session;Premedicated before session;Repositioned    Home Living Family/patient expects to be discharged to:: Skilled nursing facility Living Arrangements: Other (Comment) (ALF) Available Help at Discharge:  (ALF) Type of Home: Assisted living (Moved to Alzheimer unit 3 weeks ago) Home Access: Level entry     Home Layout: One level Home Equipment: Walker - 4 wheels      Prior Function Level of Independence: Needs assistance   Gait / Transfers Assistance Needed: Independent with rollator in supervised setting (pt moved to Alzheimer unit d/t "wandering" issues per pt's daughter)           Hand Dominance        Extremity/Trunk Assessment   Upper Extremity Assessment: Defer to OT evaluation           Lower Extremity Assessment: Difficult to assess due to impaired cognition (pt did not follow commands to assess strength and resisted ROM attempts)         Communication   Communication: HOH  Cognition Arousal/Alertness: Awake/alert Behavior During Therapy:  Anxious Overall Cognitive Status: History of cognitive impairments - at baseline (appears close to baseline)       Memory: Decreased recall of precautions;Decreased short-term memory              General Comments  Pt's daughter present during entire session.     Exercises  Pt did not follow commands for ex's and resisted ROM attempts.      Assessment/Plan    PT Assessment Patient needs continued PT services  PT Diagnosis Difficulty walking;Abnormality of gait;Acute pain   PT Problem List Decreased  strength;Decreased range of motion;Decreased activity tolerance;Decreased balance;Decreased mobility;Decreased knowledge of use of DME;Decreased safety awareness;Decreased knowledge of precautions;Pain  PT Treatment Interventions DME instruction;Gait training;Functional mobility training;Therapeutic activities;Therapeutic exercise;Balance training;Neuromuscular re-education;Patient/family education   PT Goals (Current goals can be found in the Care Plan section) Acute Rehab PT Goals Patient Stated Goal: Pt did not verbalize goal PT Goal Formulation: With family Time For Goal Achievement: 01/15/15 Potential to Achieve Goals: Fair    Frequency BID   Barriers to discharge        Co-evaluation               End of Session Equipment Utilized During Treatment: Gait belt Activity Tolerance: Patient limited by pain Patient left: in chair;with call bell/phone within reach;with chair alarm set;with nursing/sitter in room;with family/visitor present (Nursing present and reported she would put on SCD's and put towel rolls under pt's LE's to elevate heels) Nurse Communication: Mobility status         Time: 0940-1020 PT Time Calculation (min) (ACUTE ONLY): 40 min   Charges:   PT Evaluation $Initial PT Evaluation Tier I: 1 Procedure     PT G CodesHendricks Limes:        Shaela Boer 01/01/2015, 11:01 AM Hendricks LimesEmily Stephanie Mcglone, PT [phone]

## 2015-01-01 NOTE — Progress Notes (Signed)
[facility] - [place] at [facility]                                                                                                                                                                                            Patient Demographics   Madison Wallace, is a 81 y.o. female, DOB - 1917/05/31, ZOX:[id number]  Admit date - 12/31/2014   Admitting Physician Arnaldo Natal, MD  Outpatient Primary MD for the patient is No primary care provider on file.  LOS - 1  Chief Complaint  Patient presents with  . Fall      Pt confused now daughter at bedside  Review of Systems:  Unable to provide due to demetia    Vitals:   Filed Vitals:   12/31/14 2300 01/01/15 0218 01/01/15 0450 01/01/15 0900  BP: 112/46 101/46 103/52 95/52  Pulse: 58  57 58  Temp: 100.1 F (37.8 C) 97.9 F (36.6 C) 97.5 F (36.4 C) 98.3 F (36.8 C)  TempSrc: Axillary Oral Oral Oral  Resp: 16  18   Height:      Weight:      SpO2: 91% 92% 92%     Wt Readings from Last 3 Encounters:  12/31/14 53.4 kg (117 lb 11.6 oz)     Intake/Output Summary (Last 24 hours) at 01/01/15 1254 Last data filed at 01/01/15 0900  Gross per 24 hour  Intake 1217.5 ml  Output    315 ml  Net  902.5 ml    Physical Exam:   GENERAL: Pleasant-appearing in no apparent distress.  HEAD, EYES, EARS, NOSE AND THROAT: Atraumatic, normocephalic. Extraocular muscles are intact. Pupils equal and reactive to light. Sclerae anicteric. No conjunctival injection. No oro-pharyngeal erythema.  NECK: Supple. There is no jugular venous distention. No bruits, no lymphadenopathy, no thyromegaly.  HEART: Regular rate and rhythm, tachycardic. No murmurs, no rubs, no clicks.  LUNGS: Clear to auscultation bilaterally. No rales or rhonchi. No wheezes.  ABDOMEN: Soft, flat, nontender, nondistended. Has good bowel sounds. No hepatosplenomegaly appreciated.  EXTREMITIES: No evidence of any cyanosis, clubbing, or peripheral  edema.  +2 pedal and radial pulses bilaterally.  NEUROLOGIC: The patient is alert, awake,  No focal defecits, anxious SKIN: Moist and warm     Antibiotics   Anti-infectives    Start     Dose/Rate Route Frequency Ordered Stop   12/31/14 1445  ceFAZolin (ANCEF) IVPB 1 g/50 mL premix     1 g 100 mL/hr over 30 Minutes Intravenous Every 6 hours 12/31/14 1433 01/01/15 0354   12/31/14 1115  cefTRIAXone (ROCEPHIN) 1 g in dextrose 5 %  50 mL IVPB - Premix     1 g 100 mL/hr over 30 Minutes Intravenous Every 24 hours 12/31/14 1100        Medications   Scheduled Meds: . acetaminophen  500 mg Oral QHS  . busPIRone  15 mg Oral TID  . cefTRIAXone (ROCEPHIN)  IV  1 g Intravenous Q24H  . docusate sodium  100 mg Oral BID  . donepezil  10 mg Oral QHS  . enoxaparin (LOVENOX) injection  30 mg Subcutaneous Q24H  . hydrocortisone cream   Topical BID  . levothyroxine  25 mcg Oral QAC breakfast  . multivitamin-lutein  1 capsule Oral Daily  . nystatin cream  1 application Topical BID  . psyllium  1 packet Oral BID  . sertraline  100 mg Oral Daily  . traMADol  50-100 mg Oral 4 times per day  . vitamin B-12  1,000 mcg Oral Daily   Continuous Infusions: . dextrose 5 % and 0.9 % NaCl with KCl 20 mEq/L 75 mL/hr at 12/31/14 1550   PRN Meds:.acetaminophen **OR** acetaminophen, bisacodyl, calamine, fentaNYL (SUBLIMAZE) injection, HYDROmorphone (DILAUDID) injection, metoCLOPramide **OR** metoCLOPramide (REGLAN) injection, morphine injection, ondansetron **OR** ondansetron (ZOFRAN) IV, oxyCODONE, polyethylene glycol, senna-docusate   Data Review:   Micro Results No results found for this or any previous visit (from the past 240 hour(s)).  Radiology Reports Dg Pelvis Portable  12/31/2014   CLINICAL DATA:  RIGHT hip fracture.  ORIF.  EXAM: DG C-ARM 1-60 MIN - NRPT [facility]; PORTABLE PELVIS 1-2 VIEWS  COMPARISON:  12/31/2014.  FINDINGS: RIGHT hip gamma nail fixation is present. No complicating features. Distal  interlocking screw is present. Four intraoperative fluoroscopic spot films are submitted for interpretation.  IMPRESSION: RIGHT hip gamma nail fixation.   Electronically Signed   By: Andreas NewportGeoffrey  Lamke M.D.   On: 12/31/2014 13:46   Dg Chest Portable 1 View  12/31/2014   CLINICAL DATA:  Preoperative  EXAM: PORTABLE CHEST - 1 VIEW  COMPARISON:  01/06/2013  FINDINGS: There is marked unchanged cardiomegaly and mild aortic tortuosity. The lungs are grossly clear. There is no large effusion. Pulmonary vasculature is normal.  IMPRESSION: Cardiomegaly.  No acute findings.   Electronically Signed   By: Ellery Plunkaniel R Mitchell M.D.   On: 12/31/2014 03:35   Dg C-arm 1-60 Min-no Report  12/31/2014   CLINICAL DATA: right hip fracture   C-ARM 1-60 MINUTES  Fluoroscopy was utilized by the requesting physician.  No radiographic  interpretation.    Dg Hip Port Unilat With Pelvis 1v Right  12/31/2014   CLINICAL DATA:  Larey SeatFell at home.  Initial encounter.  EXAM: RIGHT HIP (WITH PELVIS) 1 VIEW PORTABLE  COMPARISON:  None.  FINDINGS: There is an intertrochanteric right hip fracture with varus angulation. There is no dislocation. No bone lesion or bony destruction is evident.  IMPRESSION: Intertrochanteric right hip fracture   Electronically Signed   By: Ellery Plunkaniel R Mitchell M.D.   On: 12/31/2014 03:16   Dg Hip Operative Unilat With Pelvis Right  12/31/2014   CLINICAL DATA:  Operative fixation of a right hip intertrochanteric fracture.  EXAM: OPERATIVE RIGHT HIP (WITH PELVIS IF PERFORMED) 4 VIEWS  TECHNIQUE: Fluoroscopic spot image(s) were submitted for interpretation post-operatively.  FLUOROSCOPY TIME:  Radiation Exposure Index (as provided by the fluoroscopic device): Not applicable  If the device does not provide the exposure index:  Fluoroscopy Time:  0 min 55 seconds  Number of Acquired Images:  4  COMPARISON:  Radiographs obtained earlier today.  FINDINGS:  Compression screw and rod fixation of the previously demonstrated right  intertrochanteric fracture with anatomic position and alignment. No complicating features.  IMPRESSION: Hardware fixation of the previously seen right intertrochanteric fracture with anatomic position and alignment.   Electronically Signed   By: Beckie SaltsSteven  Reid M.D.   On: 12/31/2014 13:04     CBC  Recent Labs Lab 12/31/14 0220 12/31/14 0647 01/01/15 0449  WBC 6.3 10.0 8.0  HGB 13.1 12.1 10.3*  HCT 39.4 37.0 31.7*  PLT 219 201 157  MCV 92.9 92.8 93.8  MCH 30.8 30.5 30.5  MCHC 33.2 32.8 32.5  RDW 14.9* 14.6* 14.8*  LYMPHSABS 1.5  --   --   MONOABS 0.6  --   --   EOSABS 0.3  --   --   BASOSABS 0.1  --   --     Chemistries   Recent Labs Lab 12/31/14 0220 12/31/14 0647 01/01/15 0416  NA 137  --  139  K 3.8  --  3.9  CL 99*  --  106  CO2 30  --  27  GLUCOSE 107*  --  140*  BUN 15  --  15  CREATININE 0.74 0.65 0.74  CALCIUM 9.1  --  8.0*  AST 21  --  17  ALT 18  --  14  ALKPHOS 53  --  38  BILITOT 0.5  --  0.6   ------------------------------------------------------------------------------------------------------------------ estimated creatinine clearance is 35.5 mL/min (by C-G formula based on Cr of 0.74). ------------------------------------------------------------------------------------------------------------------ No results for input(s): HGBA1C in the last 72 hours. ------------------------------------------------------------------------------------------------------------------ No results for input(s): CHOL, HDL, LDLCALC, TRIG, CHOLHDL, LDLDIRECT in the last 72 hours. ------------------------------------------------------------------------------------------------------------------ No results for input(s): TSH, T4TOTAL, T3FREE, THYROIDAB in the last 72 hours.  Invalid input(s): FREET3 ------------------------------------------------------------------------------------------------------------------ No results for input(s): VITAMINB12, FOLATE, FERRITIN, TIBC, IRON,  RETICCTPCT in the last 72 hours.  Coagulation profile  Recent Labs Lab 12/31/14 0220  INR 0.93    No results for input(s): DDIMER in the last 72 hours.  Cardiac Enzymes  Recent Labs Lab 12/31/14 0220  TROPONINI <0.03   ------------------------------------------------------------------------------------------------------------------ Invalid input(s): POCBNP    Assessment & Plan   Assessment/Plan This is a 81 year old female admitted for intertrochanteric fracture of the right femur. 1. Intertrochanteric fracture:  S/p repair , pt eval  2. Decubitus wound/rash wound care consult 3. Hypothyroidism: Continue Synthroid 4. Depression/anxiety: Continue buspirone and sertraline and anxiolytics as needed  5. Dementia: Alzheimer's type; stable. Continue Aricept 6. DVT prophylaxis: Heparin 7. GI prophylaxis: None     Code Status Orders        Start     Ordered   12/31/14 1434  Do not attempt resuscitation (DNR)   Continuous    Question Answer Comment  In the event of cardiac or respiratory ARREST Do not call a "code blue"   In the event of cardiac or respiratory ARREST Do not perform Intubation, CPR, defibrillation or ACLS   In the event of cardiac or respiratory ARREST Use medication by any route, position, wound care, and other measures to relive pain and suffering. May use oxygen, suction and manual treatment of airway obstruction as needed for comfort.      12/31/14 1433    Advance Directive Documentation        Most Recent Value   Type of Advance Directive  Healthcare Power of Attorney, Living will   Pre-existing out of facility DNR order (yellow form or pink MOST form)     "MOST" Form in  Place?                 Consults  ortho   DVT Prophylaxis  lovenox  Lab Results  Component Value Date   PLT 157 01/01/2015     Time Spent in minutes   Auburn Bilberry M.D on 01/01/2015 at 12:54 PM  Between 7am to 6pm - Pager - [phone]  After 6pm  go to [email or web address] - password [email or web address] [facility]  [facility]   Office  [phone]

## 2015-01-01 NOTE — Clinical Social Work Note (Signed)
Clinical Social Work Assessment  Patient Details  Name: Madison Wallace MRN: [id number] Date of Birth: 08-24-1917  Date of referral:  01/01/15               Reason for consult:  Facility Placement                Permission sought to share information with:  Chartered certified accountant granted to share information::  Yes, Verbal Permission Granted  Name::      Personal assistant::   [facility]  Relationship::   Assisted Living Facility   Contact Information:     Housing/Transportation Living arrangements for the past 2 months:  Madison Wallace of Information:  Adult Children Patient Interpreter Needed:  None Criminal Activity/Legal Involvement Pertinent to Current Situation/Hospitalization:  No - Comment as needed Significant Relationships:  Adult Children Lives with:  Facility Resident Do you feel safe going back to the place where you live?  Yes Need for family participation in patient care:  Yes (Comment)  Care giving concerns: Patient lives in a Memory Care Unit   Social Worker assessment / plan: Clinical Social Worker (Madison Wallace) met with patient and her daughter Madison Wallace [phone]. Patient was oriented to self and place. Daughter reported that patient has been a resident at [facility] for 8 years and recently moved to the [facility]. Daughter reported that patient has been to rehab at [facility] before and it was "a horrible 2 weeks." Daughter reported that [facility] was nice but patient was so confused and could hardly work with PT. Daughter reported that she would prefer that patient return to [facility]. Daughter reported that PT is hard on the patient and she is interested in making the patient comfortable. CSW requested Palliative consult from MD. Madison Wallace left message with Psychologist, prison and probation services at [facility]. FL2 complete and on chart. CSW will continue to follow and assist as needed.   Madison Wallace, Madison Wallace [phone]   Employment  status:  Retired Forensic scientist:  Information systems manager, Medicaid In [place] PT Recommendations:  Madison Wallace / Referral to community resources:  Madison Wallace  Patient/Family's Response to care: Daughter does not want patient to go to SNF. Daughter prefers patient to return to [facility]. Daughter is concerned that patient will become more confused and agitated if she is moved to another facility.   Patient/Family's Understanding of and Emotional Response to Diagnosis, Current Treatment, and Prognosis:  CSW provided emotional support and patient's daughter Madison Wallace was describing patient's memory care concerns.   Emotional Assessment Appearance:  Appears stated age Attitude/Demeanor/Rapport:    Affect (typically observed):  Other (Confused) Orientation:  Oriented to Self, Oriented to Place Alcohol / Substance use:  Not Applicable Psych involvement (Current and /or in the community):  No (Comment)  Discharge Needs  Concerns to be addressed:  Other (Comment Required (Confusion is limitiing participating with PT. ) Readmission within the last 30 days:  No Current discharge risk:  Chronically ill, Cognitively Impaired Barriers to Discharge:  Family Issues   Madison Freshwater, LCSW 01/01/2015, 4:16 PM

## 2015-01-01 NOTE — Progress Notes (Signed)
Chaplain met with patient could not assess the needs of patient, patient appears confused. Chaplain had nurse to speak with patient.  Loralyn Freshwater D. Alroy Dust Monday 01-01-2015   01/01/15 2000  Clinical Encounter Type  Visited With Patient  Visit Type Initial  Referral From Chaplain  Consult/Referral To Chaplain  Spiritual Encounters  Spiritual Needs Emotional  Stress Factors  Patient Stress Factors Lack of caregivers  Family Stress Factors Lack of caregivers

## 2015-01-01 NOTE — Progress Notes (Signed)
   Subjective: 1 Day Post-Op Procedure(s) (LRB): COMPRESSION HIP (Right) Patient reports pain as 0 on 0-10 scale.   Patient is well, and has had no acute complaints or problems We will start therapy today.  Plan is to go Skilled nursing facility after hospital stay. no nausea and no vomiting Patient denies any chest pains or shortness of breath. Objective: Vital signs in last 24 hours: Temp:  [97.5 F (36.4 C)-100.1 F (37.8 C)] 97.5 F (36.4 C) (05/09 0450) Pulse Rate:  [30-139] 57 (05/09 0450) Resp:  [10-18] 18 (05/09 0450) BP: (101-141)/(46-89) 103/52 mmHg (05/09 0450) SpO2:  [90 %-100 %] 92 % (05/09 0450) FiO2 (%):  [21 %] 21 % (05/08 1550) Still has original dressing on. No swelling to the thigh noted. Heels are non tender and elevated off the bed using rolled towels Intake/Output from previous day: 05/08 0701 - 05/09 0700 In: 1917.5 [I.V.:1917.5] Out: 640 [Urine:565; Blood:75] Intake/Output this shift:     Recent Labs  12/31/14 0220 12/31/14 0647  HGB 13.1 12.1    Recent Labs  12/31/14 0220 12/31/14 0647  WBC 6.3 10.0  RBC 4.24 3.98  HCT 39.4 37.0  PLT 219 201    Recent Labs  12/31/14 0220 12/31/14 0647 01/01/15 0416  NA 137  --  139  K 3.8  --  3.9  CL 99*  --  106  CO2 30  --  27  BUN 15  --  15  CREATININE 0.74 0.65 0.74  GLUCOSE 107*  --  140*  CALCIUM 9.1  --  8.0*    Recent Labs  12/31/14 0220  INR 0.93    EXAM General - Patient is Alert, Disorganized and Confused Extremity - Neurologically intact Neurovascular intact Sensation intact distally Intact pulses distally Dorsiflexion/Plantar flexion intact Dressing - Still has original dressing on Motor Function - intact, moving foot and toes well on exam. Skin warm and dry  Past Medical History  Diagnosis Date  . Dementia   . Generalized muscle weakness   . Diverticulosis   . Anxiety   . Hypothyroidism   . Diabetes mellitus without complication     Diet Controlled  . GERD  (gastroesophageal reflux disease)     Assessment/Plan: 1 Day Post-Op Procedure(s) (LRB): COMPRESSION HIP (Right) Active Problems:   Intertrochanteric fracture of right femur  Estimated body mass index is 19.59 kg/(m^2) as calculated from the following:   Height as of this encounter: 5\' 5"  (1.651 m).   Weight as of this encounter: 53.4 kg (117 lb 11.6 oz). Advance diet Up with therapy Discharge to SNF when medically cleared  Labs: Pending tomorrow a.m. DVT Prophylaxis - Lovenox, Foot Pumps and TED hose Weight-Bearing as tolerated to right leg D/C O2 and Pulse OX and try on Room [employer]  Angad Nabers R. [facility] PA [facility] 01/01/2015, 7:12 AM

## 2015-01-01 NOTE — Progress Notes (Signed)
Pt very confused. Needs reassurance. Foley d/c'd today per Dr. Allena KatzPatel. Takes pills whole, one at a time. VSS this shift.

## 2015-01-01 NOTE — Evaluation (Signed)
Occupational Therapy Evaluation Patient Details Name: Madison HoffMildred Wallace MRN: [id number] DOB: 05-Jan-1917 Today's Date: 01/01/2015    History of Present Illness Pt is a 81 y.o. female s/p unwitnessed fall walking to bathroom at ALF (Alzheimer Unit) sustaining an intertrochanteric R hip fracture; pt s/p R hip gamma nail fixation 12/31/14.   Clinical Impression   Pt seen for OT evaluation and had difficulty attending to questions and daughter was present to attempt to redirect her but she was very focused on wanting to get panties on but currently had a Foley catheter in place.  Pt also itching her LEs constantly which was a problem prior to surgery.  Pt requires max assist and cues for all ADLs currently with the exception of self feeding and grooming when situation is automatic.  Pt would benefit from skilled OT services to increase ADLs but most likely without assistive devices due to dementia.  [facility] and was independent in all ADLs prior to surgery and needed assist for meals and homemaking.     Follow Up Recommendations  SNF    Equipment Recommendations       Recommendations for Other Services PT consult     Precautions / Restrictions Precautions Precautions: Fall Restrictions Weight Bearing Restrictions: Yes RLE Weight Bearing: Weight bearing as tolerated      Mobility Bed Mobility Transfers                Balance Overall balance assessment: History of Falls                                          ADL Overall ADL's : Needs assistance/impaired Eating/Feeding: Minimal assistance Eating/Feeding Details (indicate cue type and reason): cues for maintaining attention to task due to dementia Grooming: Wash/dry hands;Oral care;Set up;Minimal assistance   Upper Body Bathing: Min guard;Set up   Lower Body Bathing: Maximal assistance   Upper Body Dressing : Min guard;Cueing for sequencing   Lower Body Dressing: Maximal  assistance Lower Body Dressing Details (indicate cue type and reason):  (pt with difficulty focusing on task due to wanting to put on panties but still had Foley in place;  itching LEs constantly which was prior to surgery per daughter; pt not aware of surgery)               General ADL Comments: Pt very limited in ability to participate today due to dementia and perseverating on wanting to put on panties and itching her LEs and buttocks area     Vision     Perception     Praxis      Pertinent Vitals/Pain Pain Assessment: Faces Faces Pain Scale: Hurts whole lot Pain Location: R hip Pain Descriptors / Indicators: Shooting;Sore;Sharp Pain Intervention(s): Limited activity within patient's tolerance;Monitored during session;Repositioned;Premedicated before session     Hand Dominance     Extremity/Trunk Assessment Upper Extremity Assessment Upper Extremity Assessment: Difficult to assess due to impaired cognition   Lower Extremity Assessment Lower Extremity Assessment: Defer to PT evaluation       Communication Communication Communication: HOH;Other (comment) (pt with dementia and perseverating on wanting to put on panties)   Cognition Arousal/Alertness: Awake/alert Behavior During Therapy: Anxious Overall Cognitive Status:  (History of cognitive impairments due to dementia per daughter that would cycle)       Memory: Decreased recall of precautions;Decreased short-term memory  General Comments       Exercises       Shoulder Instructions      Home Living Family/patient expects to be discharged to:: Skilled nursing facility Living Arrangements:  (ALF ) Available Help at Discharge:  (ALF) Type of Home: Assisted living Home Access: Level entry     Home Layout: One level               Home Equipment: Walker - 4 wheels          Prior Functioning/Environment Level of Independence: Needs assistance  Gait / Transfers Assistance  Needed: Independent with rollator in supervised setting (pt moved to Alzheimer unit d/t "wandering" issues per pt's daughter ADL's / Homemaking Assistance Needed: meals provided at ALF and housekeeping services provided         OT Diagnosis: Generalized weakness;Cognitive deficits;Acute pain;Altered mental status   OT Problem List: Decreased strength;Decreased activity tolerance;Decreased cognition;Pain;Decreased safety awareness   OT Treatment/Interventions:      OT Goals(Current goals can be found in the care plan section) Acute Rehab OT Goals Patient Stated Goal: unable to verbalize OT Goal Formulation: With patient/family Time For Goal Achievement: 01/15/15 Potential to Achieve Goals: Fair  OT Frequency:     Barriers to D/C:            Co-evaluation              End of Session Nurse Communication: Other (comment) (IV beeping due to air in line)  Activity Tolerance: Treatment limited secondary to agitation Patient left: in chair;with call bell/phone within reach;with family/visitor present;with chair alarm set   Time: 1130-1155 OT Time Calculation (min): 25 min Charges:  OT General Charges $OT Visit: 1 Procedure OT Evaluation $Initial OT Evaluation Tier I: 1 Procedure OT Treatments $Self Care/Home Management : 8-22 mins G-Codes:    Wallace,Madison 01/01/2015, 1:05 PM    Madison BordersSusan Wallace, OTR/L

## 2015-01-01 NOTE — Clinical Social Work Placement (Signed)
   CLINICAL SOCIAL WORK PLACEMENT  NOTE  Date:  01/01/2015  Patient Details  Name: Thomas HoffMildred Routt MRN: [id number] Date of Birth: October 30, 1916  Clinical Social Work is seeking post-discharge placement for this patient at the Skilled  Nursing Facility level of care (*CSW will initial, date and re-position this form in  chart as items are completed):  Yes   Patient/family provided with [facility] Clinical Social Work Department's list of facilities offering this level of care within the geographic area requested by the patient (or if unable, by the patient's family).  Yes   Patient/family informed of their freedom to choose among providers that offer the needed level of care, that participate in Medicare, Medicaid or managed care program needed by the patient, have an available bed and are willing to accept the patient.  Yes   Patient/family informed of [facility]'s ownership interest in [facility] and [facility], as well as of the fact that they are under no obligation to receive care at these facilities.  PASRR submitted to EDS on       PASRR number received on       Existing PASRR number confirmed on 01/01/15     FL2 transmitted to all facilities in geographic area requested by pt/family on 01/01/15     FL2 transmitted to all facilities within larger geographic area on       Patient informed that his/her managed care company has contracts with or will negotiate with certain facilities, including the following:            Patient/family informed of bed offers received.  Patient chooses bed at       Physician recommends and patient chooses bed at      Patient to be transferred to   on  .  Patient to be transferred to facility by       Patient family notified on   of transfer.  Name of family member notified:        PHYSICIAN Please sign DNR, Please sign FL2     Additional Comment:    _______________________________________________ Haig ProphetMorgan, Johngabriel Verde G,  LCSW 01/01/2015, 4:10 PM

## 2015-01-01 NOTE — Progress Notes (Signed)
[facility] [place] at [facility]                                                                                                                                                                                            Patient Demographics   Madison Wallace, is a 81 y.o. female, DOB - 07-09-1917, ZOX:[id number]  Admit date - 12/31/2014   Admitting Physician Arnaldo Natal, MD  Outpatient Primary MD for the patient is No primary care provider on file.  LOS - 1  Chief Complaint  Patient presents with  . Fall      Pt seen in pacu currently comortable denies any complaints  Review of Systems:  Unable to provide due to demetia    Vitals:   Filed Vitals:   12/31/14 2300 01/01/15 0218 01/01/15 0450 01/01/15 0900  BP: 112/46 101/46 103/52 95/52  Pulse: 58  57 58  Temp: 100.1 F (37.8 C) 97.9 F (36.6 C) 97.5 F (36.4 C) 98.3 F (36.8 C)  TempSrc: Axillary Oral Oral Oral  Resp: 16  18   Height:      Weight:      SpO2: 91% 92% 92%     Wt Readings from Last 3 Encounters:  12/31/14 53.4 kg (117 lb 11.6 oz)     Intake/Output Summary (Last 24 hours) at 01/01/15 1250 Last data filed at 01/01/15 0900  Gross per 24 hour  Intake 1917.5 ml  Output    315 ml  Net 1602.5 ml    Physical Exam:   GENERAL: Pleasant-appearing in no apparent distress.  HEAD, EYES, EARS, NOSE AND THROAT: Atraumatic, normocephalic. Extraocular muscles are intact. Pupils equal and reactive to light. Sclerae anicteric. No conjunctival injection. No oro-pharyngeal erythema.  NECK: Supple. There is no jugular venous distention. No bruits, no lymphadenopathy, no thyromegaly.  HEART: Regular rate and rhythm, tachycardic. No murmurs, no rubs, no clicks.  LUNGS: Clear to auscultation bilaterally. No rales or rhonchi. No wheezes.  ABDOMEN: Soft, flat, nontender, nondistended. Has good bowel sounds. No hepatosplenomegaly appreciated.  EXTREMITIES: No evidence of any cyanosis,  clubbing, or peripheral edema.  +2 pedal and radial pulses bilaterally.  NEUROLOGIC: The patient is alert, awake,  No focal defecits SKIN: Moist and warm     Antibiotics   Anti-infectives    Start     Dose/Rate Route Frequency Ordered Stop   12/31/14 1445  ceFAZolin (ANCEF) IVPB 1 g/50 mL premix     1 g 100 mL/hr over 30 Minutes Intravenous Every 6 hours 12/31/14 1433 01/01/15 0354   12/31/14 1115  cefTRIAXone (ROCEPHIN) 1 g in dextrose  5 % 50 mL IVPB - Premix     1 g 100 mL/hr over 30 Minutes Intravenous Every 24 hours 12/31/14 1100        Medications   Scheduled Meds: . acetaminophen  500 mg Oral QHS  . busPIRone  15 mg Oral TID  . cefTRIAXone (ROCEPHIN)  IV  1 g Intravenous Q24H  . docusate sodium  100 mg Oral BID  . donepezil  10 mg Oral QHS  . enoxaparin (LOVENOX) injection  30 mg Subcutaneous Q24H  . hydrocortisone cream   Topical BID  . levothyroxine  25 mcg Oral QAC breakfast  . multivitamin-lutein  1 capsule Oral Daily  . nystatin cream  1 application Topical BID  . psyllium  1 packet Oral BID  . sertraline  100 mg Oral Daily  . traMADol  50-100 mg Oral 4 times per day  . vitamin B-12  1,000 mcg Oral Daily   Continuous Infusions: . dextrose 5 % and 0.9 % NaCl with KCl 20 mEq/L 75 mL/hr at 12/31/14 1550   PRN Meds:.acetaminophen **OR** acetaminophen, bisacodyl, calamine, fentaNYL (SUBLIMAZE) injection, HYDROmorphone (DILAUDID) injection, metoCLOPramide **OR** metoCLOPramide (REGLAN) injection, morphine injection, ondansetron **OR** ondansetron (ZOFRAN) IV, oxyCODONE, polyethylene glycol, senna-docusate   Data Review:   Micro Results No results found for this or any previous visit (from the past 240 hour(s)).  Radiology Reports Dg Pelvis Portable  12/31/2014   CLINICAL DATA:  RIGHT hip fracture.  ORIF.  EXAM: DG C-ARM 1-60 MIN - NRPT [facility]; PORTABLE PELVIS 1-2 VIEWS  COMPARISON:  12/31/2014.  FINDINGS: RIGHT hip gamma nail fixation is present. No complicating  features. Distal interlocking screw is present. Four intraoperative fluoroscopic spot films are submitted for interpretation.  IMPRESSION: RIGHT hip gamma nail fixation.   Electronically Signed   By: Andreas NewportGeoffrey  Lamke M.D.   On: 12/31/2014 13:46   Dg Chest Portable 1 View  12/31/2014   CLINICAL DATA:  Preoperative  EXAM: PORTABLE CHEST - 1 VIEW  COMPARISON:  01/06/2013  FINDINGS: There is marked unchanged cardiomegaly and mild aortic tortuosity. The lungs are grossly clear. There is no large effusion. Pulmonary vasculature is normal.  IMPRESSION: Cardiomegaly.  No acute findings.   Electronically Signed   By: Ellery Plunkaniel R Mitchell M.D.   On: 12/31/2014 03:35   Dg C-arm 1-60 Min-no Report  12/31/2014   CLINICAL DATA: right hip fracture   C-ARM 1-60 MINUTES  Fluoroscopy was utilized by the requesting physician.  No radiographic  interpretation.    Dg Hip Port Unilat With Pelvis 1v Right  12/31/2014   CLINICAL DATA:  Larey SeatFell at home.  Initial encounter.  EXAM: RIGHT HIP (WITH PELVIS) 1 VIEW PORTABLE  COMPARISON:  None.  FINDINGS: There is an intertrochanteric right hip fracture with varus angulation. There is no dislocation. No bone lesion or bony destruction is evident.  IMPRESSION: Intertrochanteric right hip fracture   Electronically Signed   By: Ellery Plunkaniel R Mitchell M.D.   On: 12/31/2014 03:16   Dg Hip Operative Unilat With Pelvis Right  12/31/2014   CLINICAL DATA:  Operative fixation of a right hip intertrochanteric fracture.  EXAM: OPERATIVE RIGHT HIP (WITH PELVIS IF PERFORMED) 4 VIEWS  TECHNIQUE: Fluoroscopic spot image(s) were submitted for interpretation post-operatively.  FLUOROSCOPY TIME:  Radiation Exposure Index (as provided by the fluoroscopic device): Not applicable  If the device does not provide the exposure index:  Fluoroscopy Time:  0 min 55 seconds  Number of Acquired Images:  4  COMPARISON:  Radiographs obtained earlier today.  FINDINGS: Compression screw and rod fixation of the previously demonstrated  right intertrochanteric fracture with anatomic position and alignment. No complicating features.  IMPRESSION: Hardware fixation of the previously seen right intertrochanteric fracture with anatomic position and alignment.   Electronically Signed   By: Beckie Salts M.D.   On: 12/31/2014 13:04     CBC  Recent Labs Lab 12/31/14 0220 12/31/14 0647 01/01/15 0449  WBC 6.3 10.0 8.0  HGB 13.1 12.1 10.3*  HCT 39.4 37.0 31.7*  PLT 219 201 157  MCV 92.9 92.8 93.8  MCH 30.8 30.5 30.5  MCHC 33.2 32.8 32.5  RDW 14.9* 14.6* 14.8*  LYMPHSABS 1.5  --   --   MONOABS 0.6  --   --   EOSABS 0.3  --   --   BASOSABS 0.1  --   --     Chemistries   Recent Labs Lab 12/31/14 0220 12/31/14 0647 01/01/15 0416  NA 137  --  139  K 3.8  --  3.9  CL 99*  --  106  CO2 30  --  27  GLUCOSE 107*  --  140*  BUN 15  --  15  CREATININE 0.74 0.65 0.74  CALCIUM 9.1  --  8.0*  AST 21  --  17  ALT 18  --  14  ALKPHOS 53  --  38  BILITOT 0.5  --  0.6   ------------------------------------------------------------------------------------------------------------------ estimated creatinine clearance is 35.5 mL/min (by C-G formula based on Cr of 0.74). ------------------------------------------------------------------------------------------------------------------ No results for input(s): HGBA1C in the last 72 hours. ------------------------------------------------------------------------------------------------------------------ No results for input(s): CHOL, HDL, LDLCALC, TRIG, CHOLHDL, LDLDIRECT in the last 72 hours. ------------------------------------------------------------------------------------------------------------------ No results for input(s): TSH, T4TOTAL, T3FREE, THYROIDAB in the last 72 hours.  Invalid input(s): FREET3 ------------------------------------------------------------------------------------------------------------------ No results for input(s): VITAMINB12, FOLATE, FERRITIN, TIBC,  IRON, RETICCTPCT in the last 72 hours.  Coagulation profile  Recent Labs Lab 12/31/14 0220  INR 0.93    No results for input(s): DDIMER in the last 72 hours.  Cardiac Enzymes  Recent Labs Lab 12/31/14 0220  TROPONINI <0.03   ------------------------------------------------------------------------------------------------------------------ Invalid input(s): POCBNP    Assessment & Plan   Assessment/Plan This is a 81 year old female admitted for intertrochanteric fracture of the right femur. 1. Intertrochanteric fracture:  S/p repair , pt eval  2. Decubitus wound/rash wound care consult 3. Hypothyroidism: Continue Synthroid 4. Depression/anxiety: Continue buspirone and sertraline and anxiolytics as needed  5. Dementia: Alzheimer's type; stable. Continue Aricept 6. DVT prophylaxis: Heparin 7. GI prophylaxis: None     Code Status Orders        Start     Ordered   12/31/14 1434  Do not attempt resuscitation (DNR)   Continuous    Question Answer Comment  In the event of cardiac or respiratory ARREST Do not call a "code blue"   In the event of cardiac or respiratory ARREST Do not perform Intubation, CPR, defibrillation or ACLS   In the event of cardiac or respiratory ARREST Use medication by any route, position, wound care, and other measures to relive pain and suffering. May use oxygen, suction and manual treatment of airway obstruction as needed for comfort.      12/31/14 1433    Advance Directive Documentation        Most Recent Value   Type of Advance Directive  Healthcare Power of Attorney, Living will   Pre-existing out of facility DNR order (yellow form or pink MOST form)     "MOST" Form  in Place?                 Consults  ortho   DVT Prophylaxis  SCDs    Lab Results  Component Value Date   PLT 157 01/01/2015     Time Spent in minutes  45min   Auburn BilberryPATEL, Denisia Harpole M.D on 01/01/2015 at 12:50 PM  Between 7am to 6pm - Pager -  [phone]  After 6pm go to [email or web address] - password [email or web address] [facility]  [facility] [place] [facility]   Office  [phone]

## 2015-01-02 ENCOUNTER — Encounter: Payer: Self-pay | Admitting: Surgery

## 2015-01-02 DIAGNOSIS — E039 Hypothyroidism, unspecified: Secondary | ICD-10-CM

## 2015-01-02 DIAGNOSIS — L299 Pruritus, unspecified: Secondary | ICD-10-CM

## 2015-01-02 DIAGNOSIS — Z515 Encounter for palliative care: Secondary | ICD-10-CM

## 2015-01-02 DIAGNOSIS — F0390 Unspecified dementia without behavioral disturbance: Secondary | ICD-10-CM

## 2015-01-02 DIAGNOSIS — E119 Type 2 diabetes mellitus without complications: Secondary | ICD-10-CM

## 2015-01-02 DIAGNOSIS — S72001A Fracture of unspecified part of neck of right femur, initial encounter for closed fracture: Secondary | ICD-10-CM

## 2015-01-02 DIAGNOSIS — F419 Anxiety disorder, unspecified: Secondary | ICD-10-CM

## 2015-01-02 DIAGNOSIS — E43 Unspecified severe protein-calorie malnutrition: Secondary | ICD-10-CM

## 2015-01-02 DIAGNOSIS — Z79899 Other long term (current) drug therapy: Secondary | ICD-10-CM

## 2015-01-02 DIAGNOSIS — I4891 Unspecified atrial fibrillation: Secondary | ICD-10-CM

## 2015-01-02 DIAGNOSIS — K5790 Diverticulosis of intestine, part unspecified, without perforation or abscess without bleeding: Secondary | ICD-10-CM

## 2015-01-02 LAB — URINALYSIS COMPLETE WITH MICROSCOPIC (ARMC ONLY)
Bilirubin Urine: NEGATIVE
Glucose, UA: NEGATIVE mg/dL
Hgb urine dipstick: NEGATIVE
Ketones, ur: NEGATIVE mg/dL
Nitrite: NEGATIVE
PROTEIN: NEGATIVE mg/dL
Specific Gravity, Urine: 1.016 (ref 1.005–1.030)
pH: 5 (ref 5.0–8.0)

## 2015-01-02 LAB — CBC
HCT: 29.4 % — ABNORMAL LOW (ref 35.0–47.0)
Hemoglobin: 9.6 g/dL — ABNORMAL LOW (ref 12.0–16.0)
MCH: 30.5 pg (ref 26.0–34.0)
MCHC: 32.6 g/dL (ref 32.0–36.0)
MCV: 93.8 fL (ref 80.0–100.0)
Platelets: 135 10*3/uL — ABNORMAL LOW (ref 150–440)
RBC: 3.13 MIL/uL — ABNORMAL LOW (ref 3.80–5.20)
RDW: 14.5 % (ref 11.5–14.5)
WBC: 9.2 10*3/uL (ref 3.6–11.0)

## 2015-01-02 LAB — COMPREHENSIVE METABOLIC PANEL
ALK PHOS: 36 U/L — AB (ref 38–126)
ALT: 12 U/L — ABNORMAL LOW (ref 14–54)
AST: 20 U/L (ref 15–41)
Albumin: 2.7 g/dL — ABNORMAL LOW (ref 3.5–5.0)
Anion gap: 5 (ref 5–15)
BUN: 12 mg/dL (ref 6–20)
CALCIUM: 7.9 mg/dL — AB (ref 8.9–10.3)
CO2: 26 mmol/L (ref 22–32)
Chloride: 103 mmol/L (ref 101–111)
Creatinine, Ser: 0.61 mg/dL (ref 0.44–1.00)
GFR calc Af Amer: >60 mL/min (ref >60)
GFR calc non Af Amer: >60 mL/min (ref >60)
GLUCOSE: 111 mg/dL — AB (ref 65–99)
Potassium: 4.3 mmol/L (ref 3.5–5.1)
Sodium: 134 mmol/L — ABNORMAL LOW (ref 135–145)
TOTAL PROTEIN: 5.4 g/dL — AB (ref 6.5–8.1)
Total Bilirubin: 0.6 mg/dL (ref 0.3–1.2)

## 2015-01-02 LAB — GLUCOSE, CAPILLARY
Glucose-Capillary: 95 mg/dL (ref 70–99)
Glucose-Capillary: 101 mg/dL — ABNORMAL HIGH (ref 70–99)
Glucose-Capillary: 97 mg/dL (ref 70–99)
Glucose-Capillary: 131 mg/dL — ABNORMAL HIGH (ref 70–99)

## 2015-01-02 MED ORDER — PERMETHRIN 5 % EX CREA
TOPICAL_CREAM | Freq: Once | CUTANEOUS | Status: AC
  Administered 2015-01-02: 17:00:00 via TOPICAL
  Filled 2015-01-02: qty 60

## 2015-01-02 MED ORDER — IVERMECTIN 3 MG PO TABS
200.0000 ug/kg | ORAL_TABLET | Freq: Once | ORAL | Status: DC
  Filled 2015-01-02: qty 4

## 2015-01-02 NOTE — Progress Notes (Signed)
Dr. Kathie RhodesS. Patel called and notified that bladder scan reveals 403ml of urine.  Patient had to be in and out cathed at 06:00 to evacuate bladder.  Dr. Allena KatzPatel to place orders.

## 2015-01-02 NOTE — Progress Notes (Signed)
Physical Therapy Treatment Patient Details Name: Madison HoffMildred Wallace MRN: [id number] DOB: 1916-11-12 Today's Date: 01/02/2015    History of Present Illness Pt is a 81 y.o. female s/p unwitnessed fall walking to bathroom at ALF ([facility]) sustaining an intertrochanteric R hip fracture; pt s/p R hip gamma nail fixation 12/31/14.    PT Comments    Pt continues to vocalize R hip pain with functional mobility but continues to gradually improve ability to take steps with RW and 2 assist (limited distance d/t c/o pain).  Will continue to progress pt per pt tolerance.  Follow Up Recommendations  SNF     Equipment Recommendations  Rolling walker with 5" wheels    Recommendations for Other Services       Precautions / Restrictions Precautions Precautions: Fall;Other (comment) Precaution Comments: Contact isolation (possible scabies) Restrictions Weight Bearing Restrictions: Yes RLE Weight Bearing: Weight bearing as tolerated    Mobility  Bed Mobility Overal bed mobility: Needs Assistance;+2 for physical assistance Bed Mobility: Sit to Supine     Sit to supine: Max assist;+2 for physical assistance   General bed mobility comments: assist for trunk and B LE's  Transfers Overall transfer level: Needs assistance Equipment used: Rolling walker (2 wheeled) Transfers: Sit to/from UGI CorporationStand;Stand Pivot Transfers Sit to Stand: Mod assist;+2 physical assistance Stand pivot transfers: Mod assist;+2 physical assistance       General transfer comment: pt stood 2x's from commode (first trial partial stand with UE's on commode armrests and mod assist x1 with 2nd assist for clean-up; 2nd trial mod assist x2 with RW for full stand)  Ambulation/Gait Ambulation/Gait assistance: Mod assist;+2 physical assistance Ambulation Distance (Feet): 4 Feet Assistive device: Rolling walker (2 wheeled)       General Gait Details: decreased stance time R LE; antalgic; mild improvement in B step  length   Stairs            Wheelchair Mobility    Modified Rankin (Stroke Patients Only)       Balance Overall balance assessment: History of Falls                                  Cognition Arousal/Alertness: Awake/alert Behavior During Therapy: Anxious (pt perseverating on her "panties") Overall Cognitive Status: History of cognitive impairments - at baseline       Memory: Decreased recall of precautions;Decreased short-term memory              Exercises      General Comments  Pt's daughter present throughout session and pt being assisted to commode upon PT entering room.  Pt's daughter agreeable to PT treatment session.      Pertinent Vitals/Pain Pain Assessment: Faces Faces Pain Scale: Hurts whole lot (pt appears comfortable at rest but vocalizing pain with activity) Pain Location: R hip Pain Descriptors / Indicators: Sharp;Shooting;Sore Pain Intervention(s): Limited activity within patient's tolerance;Monitored during session;Premedicated before session;Repositioned    Home Living                      Prior Function            PT Goals (current goals can now be found in the care plan section) Acute Rehab PT Goals Patient Stated Goal: to improve mobility PT Goal Formulation: With patient/family Time For Goal Achievement: 01/15/15 Potential to Achieve Goals: Fair Progress towards PT goals: Progressing toward goals    Frequency  BID  PT Plan Current plan remains appropriate    Co-evaluation             End of Session Equipment Utilized During Treatment: Gait belt Activity Tolerance: Patient limited by pain Patient left: in bed;with call bell/phone within reach;with bed alarm set;with SCD's reapplied;with family/visitor present (B heels elevated off bed; pt's daughter present end of session)     Time: 1440-1500 PT Time Calculation (min) (ACUTE ONLY): 20 min  Charges:  $Gait Training: 8-22  mins $Therapeutic Activity: 8-22 mins                    G CodesHendricks Limes:      Naveen Lorusso 01/02/2015, 3:16 PM Hendricks LimesEmily Norinne Jeane, PT [phone]

## 2015-01-02 NOTE — Progress Notes (Signed)
Madison [facility]Mildred [facility] Admit Date:  5.8.16 Admit Dx:  right fractured hip [facility] Criteria:  Dementia Ordered Disciplines:  SN/PT/OT Discharge:  tentatively tomorrow F2F:  Needed Hospital Summary:  New referral on 81yo female who [facility] with right hip fracture.  Patient found [facility] the floor at [facility] and was unable to get up.  Past medical history of Alzheimers dementia, muscle weakness, diverticulosis, anxiety and hypothyroidism.  [facility]  Patients daughter is Knox SalivaBeth Whited and is involved in her care. Patient is post palliative medicine consult per daughter's request.  Patient is not appropriate for hospice services at this time, but maybe in the near future depending on patient's recovery from surgery.  I discussed this briefly with patient's daghther, [name].   Patient had reduction/internal fixation of right hip fracture on 5.8.16.  Patient has had PT eval today.  Patient plans to discharge back to [facility] tomorrow.  [facility]  Discussed home health criteria with patients daughter Knox SalivaBeth Whited who verbalized understanding and request services at discharge.  Patient has order for lovonox injection at home.  [facility] has LPN  M-F and can administer Lovonox injections.  Discussed possible need for family to be educated on Lovonox injections for weekend administration.  Case manager is clarifying with MD if Lovonox injections will be needed at discharge.  Information faxed to referral intake.  Will continue to follow through final disposition.  Thank you for allowing participation in this patient's care.  Norris CrossKara H Marshall, RN Clinical Nurse Liaison [facility]  (a division of [facility]) [phone]

## 2015-01-02 NOTE — Care Management Note (Signed)
Case Management Note  Patient Details  Name: Madison Wallace MRN: [id number] Date of Birth: May 18, 1917  Subjective/Objective:                   Per CSW patient's family requesting that patient return to [facility]F at discharge with home health and they'd like to use [facility]. CS[facility] and she is requesting semi-electric hospital bed.   Action/Plan: Patient has dementia and s/p hip fracture/repair which requires lower extremities to be positioned in ways not feasible with a normal bed. Head must be elevated at least 30 degrees or more. (30 or more)  Referral made to [facility] home [facility].   Expected Discharge Date:  01/03/15               Expected Discharge Plan:  Assisted Living / Rest Home  In-House Referral:     Discharge planning Services  CM Consult  Post Acute Care Choice:  Durable Medical Equipment, Home Health Choice offered to:  [facility]  DME Arranged:  Hospital bed DME Agency:  [facility]  HH Arranged:  [facility] Agency:  Other - See comment  Status of Service:  In process, will continue to follow  Medicare Important Message Given:  Yes Date Medicare IM Given:  01/01/15 Medicare IM give by:  Collie SiadAngela Holland Kotter Date Additional Medicare IM Given:    Additional Medicare Important Message give by:     If discussed at Long Length of Stay Meetings, dates discussed:    Additional Comments:  Collie Siadngela Jaxon Flatt, RN 01/02/2015, 9:56 AM

## 2015-01-02 NOTE — Progress Notes (Signed)
MD notified about not voiding and bladder scan results, may in and out cath if not voided by end of shift

## 2015-01-02 NOTE — Progress Notes (Signed)
[facility]. Infectious Disease MD believes that patient does not have scabies. CSW made [facility] aware of above. CSW provided emotional support to daughter as she was tearful today. Daughter is concerned about patient's level of care. CSW provided SNF bed offers and explained options. Per daughter she wants patient to return to [facility] with [facility]. CSW will continue to follow and assist as needed.   Jetta LoutBailey Morgan, LCSWA [phone]

## 2015-01-02 NOTE — Progress Notes (Signed)
[facility] they have an LPN on staff that can administer the Lovenox injections Monday through Friday. They do not have staff to administer the injections on the weekends. RN Case Manager aware of above. CSW will continue to follow and assist as needed.   Jetta LoutBailey Morgan, LCSWA [phone]

## 2015-01-02 NOTE — Discharge Planning (Addendum)
INSTRUCTIONS AFTER Surgery  o Remove items at home which could result in a fall. This includes throw rugs or furniture in walking pathways o ICE to the affected joint every three hours while awake for 30 minutes at a time, for at least the first 3-5 days, and then as needed for pain and swelling.  Continue to use ice for pain and swelling. You may notice swelling that will progress down to the foot and ankle.  This is normal after surgery.  Elevate your leg when you are not up walking on it.   o Continue to use the breathing machine you got in the hospital (incentive spirometer) which will help keep your temperature down.  It is common for your temperature to cycle up and down following surgery, especially at night when you are not up moving around and exerting yourself.  The breathing machine keeps your lungs expanded and your temperature down.   DIET:  As you were doing prior to hospitalization, we recommend a well-balanced diet.  DRESSING / WOUND CARE / SHOWERING  Please remove staples on 01/14/2015 and apply benzoin and Steri-Strips You may change your dressing as needed after surgery.  Then change the dressing when needed with sterile gauze.  Please use good hand washing techniques before changing the dressing.  Do not use any lotions or creams on the incision until instructed by your surgeon.  ACTIVITY  o Increase activity slowly as tolerated, but follow the weight bearing instructions below.   o No driving for 6 weeks or until further direction given by your physician.  You cannot drive while taking narcotics.  o No lifting or carrying greater than 10 lbs. until further directed by your surgeon. o Avoid periods of inactivity such as sitting longer than an hour when not asleep. This helps prevent blood clots.  o You may return to work once you are authorized by your doctor.     WEIGHT BEARING   Weight bearing as tolerated with assist device (walker, cane, etc) as directed, use it as  long as suggested by your surgeon or therapist, typically at least 4-6 weeks.   EXERCISES  Results after joint surgery are often greatly improved when you follow the exercise, range of motion and muscle strengthening exercises prescribed by your doctor. Safety measures are also important to protect the joint from further injury. Any time any of these exercises cause you to have increased pain or swelling, decrease what you are doing until you are comfortable again and then slowly increase them. If you have problems or questions, call your caregiver or physical therapist for advice.   Rehabilitation is important following a joint surgery. After just a few days of immobilization, the muscles of the leg can become weakened and shrink (atrophy).  These exercises are designed to build up the tone and strength of the thigh and leg muscles and to improve motion. Often times heat used for twenty to thirty minutes before working out will loosen up your tissues and help with improving the range of motion but do not use heat for the first two weeks following surgery (sometimes heat can increase post-operative swelling).   These exercises can be done on a training (exercise) mat, on the floor, on a table or on a bed. Use whatever works the best and is most comfortable for you.    Use music or television while you are exercising so that the exercises are a pleasant break in your day. This will make your life better  with the exercises acting as a break in your routine that you can look forward to.   Perform all exercises about fifteen times, three times per day or as directed.  You should exercise both the operative leg and the other leg as well.  Exercises include:   . Quad Sets - Tighten up the muscle on the front of the thigh (Quad) and hold for 5-10 seconds.   . Straight Leg Raises - With your knee straight (if you were given a brace, keep it on), lift the leg to 60 degrees, hold for 3 seconds, and slowly lower  the leg.  Perform this exercise against resistance later as your leg gets stronger.  . Leg Slides: Lying on your back, slowly slide your foot toward your buttocks, bending your knee up off the floor (only go as far as is comfortable). Then slowly slide your foot back down until your leg is flat on the floor again.  Madison Wallace. Angel Wings: Lying on your back spread your legs to the side as far apart as you can without causing discomfort.  . Hamstring Strength:  Lying on your back, push your heel against the floor with your leg straight by tightening up the muscles of your buttocks.  Repeat, but this time bend your knee to a comfortable angle, and push your heel against the floor.  You may put a pillow under the heel to make it more comfortable if necessary.   A rehabilitation program following joint surgery can speed recovery and prevent re-injury in the future due to weakened muscles. Contact your doctor or a physical therapist for more information on knee rehabilitation.    CONSTIPATION  Constipation is defined medically as fewer than three stools per week and severe constipation as less than one stool per week.  Even if you have a regular bowel pattern at home, your normal regimen is likely to be disrupted due to multiple reasons following surgery.  Combination of anesthesia, postoperative narcotics, change in appetite and fluid intake all can affect your bowels.   YOU MUST use at least one of the following options; they are listed in order of increasing strength to get the job done.  They are all available over the counter, and you may need to use some, POSSIBLY even all of these options:    Drink plenty of fluids (prune juice may be helpful) and high fiber foods Colace 100 mg by mouth twice a day  Senokot for constipation as directed and as needed Dulcolax (bisacodyl), take with full glass of water  Miralax (polyethylene glycol) once or twice a day as needed.  If you have tried all these things and are  unable to have a bowel movement in the first 3-4 days after surgery call either your surgeon or your primary doctor.    If you experience loose stools or diarrhea, hold the medications until you stool forms back up.  If your symptoms do not get better within 1 week or if they get worse, check with your doctor.  If you experience "the worst abdominal pain ever" or develop nausea or vomiting, please contact the office immediately for further recommendations for treatment.   ITCHING:  If you experience itching with your medications, try taking only a single pain pill, or even half a pain pill at a time.  You can also use Benadryl over the counter for itching or also to help with sleep.   TED HOSE STOCKINGS:  Use stockings on both legs until for  at least 6 weeks or as directed by physician office. They may be removed at night for sleeping.  MEDICATIONS:  See your medication summary on the "After Visit Summary" that nursing will review with you.  You may have some home medications which will be placed on hold until you complete the course of blood thinner medication.  It is important for you to complete the blood thinner medication as prescribed.  PRECAUTIONS:  If you experience chest pain or shortness of breath - call 911 immediately for transfer to the hospital emergency department.   If you develop a fever greater that 101 F, purulent drainage from wound, increased redness or drainage from wound, foul odor from the wound/dressing, or calf pain - CONTACT YOUR SURGEON.                                                   FOLLOW-UP APPOINTMENTS:  If you do not already have a post-op appointment, please call the office for an appointment to be seen by your surgeon.  Guidelines for how soon to be seen are listed in your "After Visit Summary", but are typically between 6 weeks after surgery.  OTHER INSTRUCTIONS:     MAKE SURE YOU:  . Understand these instructions.  . Get help right away if you are not  doing well or get worse.    Thank you for letting us be a part of your medical care team.  It is a privilege we respect greatly.  We hope these instructions will help you stay on track for a fast and full recovery!

## 2015-01-02 NOTE — Progress Notes (Signed)
[facility] administrator has agreed to take patient back with [facility] following. Clinical Social Worker (CSW) met with patient's daughter Eustaquio Maize at bedside and made her aware of above. Daughter requested a hospital bed. RN Case Manager aware of above. CSW also made daughter aware that if patient gets back to [facility] and it is not working out she has 30 days from her discharge from the hospital to go to rehab. Daughter verbalized her understanding.    Plan is for patient to D/C back to [place] with [facility] following. CSW will continue to follow and assist as needed.   [name], [place] [phone]

## 2015-01-02 NOTE — Progress Notes (Signed)
Patient alert and orient to name able to make needs known, skin warm and dry. Incision dry and intact. No complaints of pain, resting quitely.

## 2015-01-02 NOTE — Progress Notes (Signed)
Madison CroftMildred remains confused at baseline r/t hx dementia. Tolerating POs. Pain controlled with scheduled pain medication. In and out cath of 425mL preformed d/t no void this shift, bladder scan 433mL. Nursing continues to monitor and assist with ADLs.

## 2015-01-02 NOTE — Progress Notes (Signed)
  Subjective: 2 Days Post-Op Procedure(s) (LRB): COMPRESSION HIP (Right) Patient reports pain as mild.   Patient seen in rounds with Dr. Joice LoftsPoggi. Patient is well, and has had no acute complaints or problems.  She is mildly confused. Plan is to go [facility] after hospital stay. Negative for chest pain and shortness of breath Fever: no Gastrointestinal:negative for nausea and vomiting  Objective: Vital signs in last 24 hours: Temp:  [98 F (36.7 C)-100.5 F (38.1 C)] 100.5 F (38.1 C) (05/10 0416) Pulse Rate:  [58-80] 77 (05/10 0416) Resp:  [18-19] 19 (05/10 0416) BP: (95-113)/(39-71) 104/46 mmHg (05/10 0416) SpO2:  [82 %-93 %] 91 % (05/10 0416)  Intake/Output from previous day:  Intake/Output Summary (Last 24 hours) at 01/02/15 0705 Last data filed at 01/02/15 0600  Gross per 24 hour  Intake 2029.25 ml  Output    425 ml  Net 1604.25 ml    Intake/Output this shift:    Labs:  Recent Labs  12/31/14 0220 12/31/14 0647 01/01/15 0449 01/02/15 0528  HGB 13.1 12.1 10.3* 9.6*    Recent Labs  01/01/15 0449 01/02/15 0528  WBC 8.0 9.2  RBC 3.38* 3.13*  HCT 31.7* 29.4*  PLT 157 135*    Recent Labs  01/01/15 0416 01/02/15 0528  NA 139 134*  K 3.9 4.3  CL 106 103  CO2 27 26  BUN 15 12  CREATININE 0.74 0.61  GLUCOSE 140* 111*  CALCIUM 8.0* 7.9*    Recent Labs  12/31/14 0220  INR 0.93     EXAM General - Patient is Alert and Confused Extremity - Neurovascular intact Dorsiflexion/Plantar flexion intact Incision: no drainage and new dressing applied. Dressing/Incision - clean, dry, tender Motor Function - intact, moving foot and toes well on exam. Painful with motion  Past Medical History  Diagnosis Date  . Dementia   . Generalized muscle weakness   . Diverticulosis   . Anxiety   . Hypothyroidism   . Diabetes mellitus without complication     Diet Controlled  . GERD (gastroesophageal reflux disease)     Assessment/Plan: 2 Days  Post-Op Procedure(s) (LRB): COMPRESSION HIP (Right) Active Problems:   Intertrochanteric fracture of right femur  Estimated body mass index is 19.59 kg/(m^2) as calculated from the following:   Height as of this encounter: 5\' 5"  (1.651 m).   Weight as of this encounter: 53.4 kg (117 lb 11.6 oz). Advance diet D/C IV fluids Plan for discharge tomorrow  DVT Prophylaxis - Lovenox Weight-Bearing as tolerated to Right leg  Dedra Skeensodd Jamiya Nims, PA-C Orthopaedic Surgery 01/02/2015, 7:05 AM

## 2015-01-02 NOTE — Outcomes Assessment (Signed)
Pt up to chair with PT today noted to have done better. Still on able to void on her own foley placed urine sent results called to Dr Allena KatzPatel. Abd distended laxatives given large BM. Rash on chest and abd possibly scabies pt currently on contact isolation and medicated for scabies. Daughter family and skilled facility she comes from are aware. ID involved.

## 2015-01-02 NOTE — Plan of Care (Signed)
Problem: Consults Goal: Diagnosis- Total Joint Replacement Outcome: Progressing Primary Total Hip     

## 2015-01-02 NOTE — Consult Note (Signed)
Palliative Medicine Inpatient Consult Note   Name: Madison Wallace Date: 01/02/2015 MRN: [id number]  DOB: 01/05/1917  Referring Physician: Auburn BilberryShreyang Patel, MD  Palliative Care consult requested for this 81 y.o. female for goals of medical therapy in patient with Dementia, anxiety, hypothyroidism, diverticulosis, and diet controlled diabetes mellitus.  Pt presented to ER from [facility] on 12-31-2014 after fall.  Er workup significant for intertrochantic fx of R femur.  Pt admitted for surgery.  Pt is now s/p reduction and internal fixation of R hip.  Family at bedside.    REVIEW OF SYSTEMS:  Pain: None Dyspnea:  No Nausea/Vomiting:  No All other systems were reviewed and found to be negative    SOCIAL HISTORY: [facility]  Pt is widowed and has one daughter [name], [name].  reports that she has never smoked. She has never used smokeless tobacco. She reports that she does not drink alcohol.  LEGAL DOCUMENTS:  No HCPOA or advanced directives  CODE STATUS: DNR  PAST MEDICAL HISTORY: Past Medical History  Diagnosis Date  . Dementia   . Generalized muscle weakness   . Diverticulosis   . Anxiety   . Hypothyroidism   . Diabetes mellitus without complication     Diet Controlled  . GERD (gastroesophageal reflux disease)     PAST SURGICAL HISTORY: History reviewed. No pertinent past surgical history.  ALLERGIES:  is allergic to penicillins.  MEDICATIONS:  Current Facility-Administered Medications  Medication Dose Route Frequency Provider Last Rate Last Dose  . acetaminophen (TYLENOL) tablet 650 mg  650 mg Oral Q6H PRN Christena FlakeJohn J Poggi, MD       Or  . acetaminophen (TYLENOL) suppository 650 mg  650 mg Rectal Q6H PRN Christena FlakeJohn J Poggi, MD      . acetaminophen (TYLENOL) tablet 500 mg  500 mg Oral QHS Arnaldo NatalMichael S Diamond, MD   500 mg at 01/01/15 2306  . bisacodyl (DULCOLAX) suppository 10 mg  10 mg Rectal Daily PRN Christena FlakeJohn J Poggi, MD      . busPIRone (BUSPAR) tablet 15 mg  15 mg Oral  TID Arnaldo NatalMichael S Diamond, MD   15 mg at 01/01/15 2305  . calamine lotion   Topical BID PRN Arnaldo NatalMichael S Diamond, MD      . cefTRIAXone (ROCEPHIN) 1 g in dextrose 5 % 50 mL IVPB - Premix  1 g Intravenous Q24H Auburn BilberryShreyang Patel, MD   1 g at 01/01/15 1504  . dextrose 5 % and 0.9 % NaCl with KCl 20 mEq/L infusion   Intravenous Continuous Christena FlakeJohn J Poggi, MD 20 mL/hr at 01/02/15 0406    . docusate sodium (COLACE) capsule 100 mg  100 mg Oral BID Christena FlakeJohn J Poggi, MD   100 mg at 01/01/15 2306  . donepezil (ARICEPT) tablet 10 mg  10 mg Oral QHS Arnaldo NatalMichael S Diamond, MD   10 mg at 01/01/15 2312  . enoxaparin (LOVENOX) injection 30 mg  30 mg Subcutaneous Q24H Christena FlakeJohn J Poggi, MD   30 mg at 01/01/15 0819  . fentaNYL (SUBLIMAZE) injection 25 mcg  25 mcg Intravenous Q5 min PRN Forestine Chutearrie Polin, MD      . hydrocortisone cream 1 %   Topical BID Arnaldo NatalMichael S Diamond, MD      . HYDROmorphone (DILAUDID) injection 0.25-0.5 mg  0.25-0.5 mg Intravenous Q2H PRN Christena FlakeJohn J Poggi, MD      . levothyroxine (SYNTHROID, LEVOTHROID) tablet 25 mcg  25 mcg Oral QAC breakfast Arnaldo NatalMichael S Diamond, MD   25 mcg  at 01/01/15 0818  . LORazepam (ATIVAN) tablet 0.5 mg  0.5 mg Oral Q12H PRN Auburn Bilberry, MD   0.5 mg at 01/01/15 1718  . metoCLOPramide (REGLAN) tablet 5-10 mg  5-10 mg Oral Q8H PRN Christena Flake, MD       Or  . metoCLOPramide (REGLAN) injection 5-10 mg  5-10 mg Intravenous Q8H PRN Christena Flake, MD      . morphine 2 MG/ML injection 1 mg  1 mg Intravenous Q4H PRN Arnaldo Natal, MD   1 mg at 12/31/14 1021  . multivitamin-lutein (OCUVITE-LUTEIN) capsule 1 capsule  1 capsule Oral Daily Arnaldo Natal, MD   1 capsule at 12/31/14 0737  . nystatin cream (MYCOSTATIN) 1 application  1 application Topical BID Arnaldo Natal, MD   1 application at 01/01/15 2313  . ondansetron (ZOFRAN) tablet 4 mg  4 mg Oral Q6H PRN Christena Flake, MD       Or  . ondansetron [facility]) injection 4 mg  4 mg Intravenous Q6H PRN Christena Flake, MD      . oxyCODONE (Oxy IR/ROXICODONE)  immediate release tablet 5-10 mg  5-10 mg Oral Q3H PRN Christena Flake, MD      . polyethylene glycol (MIRALAX / GLYCOLAX) packet 17 g  17 g Oral Daily PRN Arnaldo Natal, MD      . psyllium (HYDROCIL/METAMUCIL) packet 1 packet  1 packet Oral BID Arnaldo Natal, MD   1 packet at 01/01/15 2307  . senna-docusate (Senokot-S) tablet 1 tablet  1 tablet Oral QHS PRN Christena Flake, MD      . sertraline (ZOLOFT) tablet 100 mg  100 mg Oral Daily Arnaldo Natal, MD   100 mg at 01/01/15 0951  . traMADol (ULTRAM) tablet 50-100 mg  50-100 mg Oral 4 times per day Christena Flake, MD   50 mg at 01/02/15 0549  . vitamin B-12 (CYANOCOBALAMIN) tablet 1,000 mcg  1,000 mcg Oral Daily Arnaldo Natal, MD   1,000 mcg at 01/01/15 0951    Vital Signs: BP 102/52 mmHg  Pulse 71  Temp(Src) 97.4 F (36.3 C) (Oral)  Resp 18  Ht  (1.651 m)  Wt 53.4 kg (117 lb 11.6 oz)  BMI 19.59 kg/m2  SpO2 91% Filed Weights   12/31/14 0214 12/31/14 0607  Weight: 54.432 kg (120 lb) 53.4 kg (117 lb 11.6 oz)    Estimated body mass index is 19.59 kg/(m^2) as calculated from the following:   Height as of this encounter:  (1.651 m).   Weight as of this encounter: 53.4 kg (117 lb 11.6 oz).  PERFORMANCE STATUS (ECOG) : 2 - Symptomatic, <50% confined to bed  PHYSICAL EXAM: General appearance: alert and cooperative Head: Normocephalic, without obvious abnormality, atraumatic Neck: supple, symmetrical, trachea midline and thyroid not enlarged, symmetric, no tenderness/mass/nodules Resp: clear to auscultation bilaterally Cardio: regular rate and rhythm, S1, S2 normal, no murmur, click, rub or gallop GI: soft, non-tender; bowel sounds normal; no masses,  no organomegaly Extremities: extremities normal, atraumatic, no cyanosis or edema and R hip shows no signs of infection post surgery Neurologic: Grossly normal  LABS: CBC:    Component Value Date/Time   WBC 9.2 01/02/2015 0528   WBC 7.6 06/04/2014 0618   HGB 9.6*  01/02/2015 0528   HGB 12.4 06/04/2014 0618   HCT 29.4* 01/02/2015 0528   HCT 37.9 06/04/2014 0618   PLT 135* 01/02/2015 0528   PLT 165 06/04/2014  0618   MCV 93.8 01/02/2015 0528   MCV 93 06/04/2014 0618   NEUTROABS 3.9 12/31/2014 0220   NEUTROABS 6.0 06/04/2014 0618   LYMPHSABS 1.5 12/31/2014 0220   LYMPHSABS 0.6* 06/04/2014 0618   MONOABS 0.6 12/31/2014 0220   MONOABS 0.7 06/04/2014 0618   EOSABS 0.3 12/31/2014 0220   EOSABS 0.2 06/04/2014 0618   BASOSABS 0.1 12/31/2014 0220   BASOSABS 0.0 06/04/2014 0618   Comprehensive Metabolic Panel:    Component Value Date/Time   NA 134* 01/02/2015 0528   NA 136 06/01/2014 2348   K 4.3 01/02/2015 0528   K 3.9 06/01/2014 2348   CL 103 01/02/2015 0528   CL 100 06/01/2014 2348   CO2 26 01/02/2015 0528   CO2 28 06/01/2014 2348   BUN 12 01/02/2015 0528   BUN 14 06/01/2014 2348   CREATININE 0.61 01/02/2015 0528   CREATININE 0.82 06/01/2014 2348   GLUCOSE 111* 01/02/2015 0528   GLUCOSE 94 06/01/2014 2348   CALCIUM 7.9* 01/02/2015 0528   CALCIUM 8.3* 06/01/2014 2348   AST 20 01/02/2015 0528   AST 26 06/01/2014 2348   ALT 12* 01/02/2015 0528   ALT 22 06/01/2014 2348   ALKPHOS 36* 01/02/2015 0528   ALKPHOS 42* 06/01/2014 2348   BILITOT 0.6 01/02/2015 0528   PROT 5.4* 01/02/2015 0528   PROT 6.7 06/01/2014 2348   ALBUMIN 2.7* 01/02/2015 0528   ALBUMIN 3.2* 06/01/2014 2348    IMPRESSION:  Palliative Care consult requested for this 81 y.o. female for goals of medical therapy in patient with Dementia, anxiety, hypothyroidism, diverticulosis, and diet controlled diabetes mellitus.  Pt presented to ER from [facility] on 12-31-2014 after fall.  Er workup significant for intertrochantic fx of R femur.  Pt admitted for surgery.  Pt is now s/p reduction and internal fixation of R hip.  Family at bedside.   Pt currently resting in bed comfortably and feeding herself breakfast.  Pt has eaten atleast 50% of each meal since coming off NPO status.  Pt  was able to work with PT yesterday and able to bear weight. Daughter at bedside and agrees to goals of care conversation.  Daughter states at baseline pt is ambulatory with walker and is able to feed herself.  Pt recognizes daughter.  Pt needs help with dressing and bathing self.  Daughter states that she would like for [facility].  Spoke with CSW and pt could return with home health services and have PT provided through them.  Daughter agrees with this plan.  Pt pain currently controlled on tramadol.  PLAN: 1. DNR 2. Cont tramadol for pain 3. D/C to [facility] with [facility]health when able     More than 50% of the visit was spent in counseling/coordination of care: Yes  Time Spent: 75 minutes

## 2015-01-02 NOTE — Progress Notes (Signed)
Physical Therapy Treatment Patient Details Name: Madison HoffMildred Wallace MRN: [id number] DOB: September 21, 1916 Today's Date: 01/02/2015    History of Present Illness Pt is a 81 y.o. female s/p unwitnessed fall walking to bathroom at [facility]F ([facility]) sustaining an intertrochanteric R hip fracture; pt s/p R hip gamma nail fixation 12/31/14.    PT Comments    Although pt did not ambulate more than 4 feet with RW and was c/o pain in R hip, pt appeared to tolerate session better (including appearing with improved tolerance taking steps with RW and 2 assist).  Will continue to progress pt per pt tolerance.  Follow Up Recommendations  SNF     Equipment Recommendations  Rolling walker with 5" wheels    Recommendations for Other Services       Precautions / Restrictions Precautions Precautions: Fall Restrictions Weight Bearing Restrictions: Yes RLE Weight Bearing: Weight bearing as tolerated    Mobility  Bed Mobility Overal bed mobility: Needs Assistance;+2 for physical assistance Bed Mobility: Supine to Sit     Supine to sit: Max assist;+2 for physical assistance;HOB elevated     General bed mobility comments: assist for trunk and B LE's  Transfers Overall transfer level: Needs assistance Equipment used: Rolling walker (2 wheeled) Transfers: Sit to/from [employer] CorporationStand;Stand Pivot Transfers Sit to Stand: Max assist;+2 physical assistance Stand pivot transfers: Max assist;+2 physical assistance       General transfer comment: vc's required for hand and feet placement  Ambulation/Gait Ambulation/Gait assistance: Mod assist;+2 physical assistance Ambulation Distance (Feet): 4 Feet Assistive device: Rolling walker (2 wheeled)       General Gait Details: decreased stance time R LE; antalgic; decreased B step length   Stairs            Wheelchair Mobility    Modified Rankin (Stroke Patients Only)       Balance Overall balance assessment: History of Falls                                   Cognition Arousal/Alertness: Awake/alert Behavior During Therapy: Anxious Overall Cognitive Status: History of cognitive impairments - at baseline       Memory: Decreased recall of precautions;Decreased short-term memory              Exercises      General Comments        Pertinent Vitals/Pain Pain Assessment: Faces Faces Pain Scale: Hurts whole lot (appears comfortable at rest but 8/10 with activity) Pain Location: R hip Pain Descriptors / Indicators: Sharp;Shooting;Sore Pain Intervention(s): Limited activity within patient's tolerance;Monitored during session;Premedicated before session;Repositioned    Home Living                      Prior Function            PT Goals (current goals can now be found in the care plan section) Acute Rehab PT Goals Patient Stated Goal: to improve mobility PT Goal Formulation: With patient/family Time For Goal Achievement: 01/15/15 Potential to Achieve Goals: Fair Progress towards PT goals: Progressing toward goals    Frequency  BID    PT Plan Current plan remains appropriate    Co-evaluation             End of Session Equipment Utilized During Treatment: Gait belt Activity Tolerance: Patient limited by pain Patient left: in chair;with call bell/phone within reach;with chair alarm set;with SCD's reapplied (B  heels elevated via towel roll)     Time: 1145-1210 PT Time Calculation (min) (ACUTE ONLY): 25 min  Charges:  $Gait Training: 8-22 mins $Therapeutic Activity: 8-22 mins                    G CodesHendricks Wallace:      Madison Wallace 01/02/2015, 2:29 PM Madison LimesEmily Betha Wallace, PT [phone]

## 2015-01-02 NOTE — Consult Note (Signed)
Reason for Consult: Rash    Referring Physician: Posey Pronto  Active Problems:   Intertrochanteric fracture of right femur  HPI: Madison Wallace is a 81 y.o. female who lives in memory unit [facility] home [facility] admitted with hip fracture. She was found to have rash in her groin and abd. Per her daughter she has had rash esp on her back for over 2 years and has been seen by dermatology. Treated for fungal infection. Geriatric psych follows her for dementia and feels the rash is from her recurrent obsessive itching.   Past Medical History  Diagnosis Date  . Dementia   . Generalized muscle weakness   . Diverticulosis   . Anxiety   . Hypothyroidism   . Diabetes mellitus without complication     Diet Controlled  . GERD (gastroesophageal reflux disease)     Allergies:  Allergies  Allergen Reactions  . Penicillins Other (See Comments)    Unknown reaction    Current antibiotics: Antibiotics Given (last 72 hours)    Date/Time Action Medication Dose Rate   12/31/14 1550 Given   ceFAZolin (ANCEF) IVPB 1 g/50 mL premix 1 g 100 mL/hr   12/31/14 2024 Given   ceFAZolin (ANCEF) IVPB 1 g/50 mL premix 1 g 100 mL/hr   01/01/15 0324 Given   ceFAZolin (ANCEF) IVPB 1 g/50 mL premix 1 g 100 mL/hr   01/01/15 1504 Given  [pharmacey delay]   cefTRIAXone (ROCEPHIN) 1 g in dextrose 5 % 50 mL IVPB - Premix 1 g 100 mL/hr   01/02/15 1021 Given   cefTRIAXone (ROCEPHIN) 1 g in dextrose 5 % 50 mL IVPB - Premix 1 g 100 mL/hr     MEDICATIONS: . acetaminophen  500 mg Oral QHS  . busPIRone  15 mg Oral TID  . cefTRIAXone (ROCEPHIN)  IV  1 g Intravenous Q24H  . docusate sodium  100 mg Oral BID  . donepezil  10 mg Oral QHS  . enoxaparin (LOVENOX) injection  30 mg Subcutaneous Q24H  . hydrocortisone cream   Topical BID  . ivermectin  200 mcg/kg Oral Once  . levothyroxine  25 mcg Oral QAC breakfast  . multivitamin-lutein  1 capsule Oral Daily  . nystatin cream  1 application Topical BID  . psyllium  1 packet  Oral BID  . sertraline  100 mg Oral Daily  . traMADol  50-100 mg Oral 4 times per day  . vitamin B-12  1,000 mcg Oral Daily    History  Substance Use Topics  . Smoking status: Never Smoker   . Smokeless tobacco: Never Used  . Alcohol Use: No    History reviewed. No pertinent family history.  Review of Systems - unable to obtain   OBJECTIVE: Temp:  [97.4 F (36.3 C)-100.5 F (38.1 C)] 97.4 F (36.3 C) (05/10 0744) Pulse Rate:  [35-80] 35 (05/10 1122) Resp:  [18-19] 18 (05/10 1122) BP: (93-124)/(36-71) 124/48 mmHg (05/10 1122) SpO2:  [82 %-97 %] 97 % (05/10 1122) Constitutional: pleasantly demented HENT:  Head: Normocephalic and atraumatic.  Eyes: EOM are normal. Pupils are equal, round, and reactive to light.  Neck: Normal range of motion. No tracheal deviation present. No thyromegaly present.  Cardiovascular: Normal rate, regular rhythm and normal heart sounds. Respiratory: Effort normal and breath sounds normal.  GI: Soft. Bowel sounds are normal. She exhibits no distension. There is no tenderness.  Musculoskeletal: She exhibits no edema.  Lymphadenopathy:  She has no cervical adenopathy.  Neurological: She is alert but  with pleasant dementia Skin: Skin is warm and dry. On abd has multiple excorciations   LABS: Results for orders placed or performed during the hospital encounter of 12/31/14 (from the past 48 hour(s))  Comprehensive metabolic panel     Status: Abnormal   Collection Time: 01/01/15  4:16 AM  Result Value Ref Range   Sodium 139 135 - 145 mmol/L   Potassium 3.9 3.5 - 5.1 mmol/L   Chloride 106 101 - 111 mmol/L   CO2 27 22 - 32 mmol/L   Glucose, Bld 140 (H) 65 - 99 mg/dL   BUN 15 6 - 20 mg/dL   Creatinine, Ser 0.74 0.44 - 1.00 mg/dL   Calcium 8.0 (L) 8.9 - 10.3 mg/dL   Total Protein 5.4 (L) 6.5 - 8.1 g/dL   Albumin 2.9 (L) 3.5 - 5.0 g/dL   AST 17 15 - 41 U/L   ALT 14 14 - 54 U/L   Alkaline Phosphatase 38 38 - 126 U/L   Total Bilirubin 0.6 0.3 -  1.2 mg/dL   GFR calc non Af Amer >60 >60 mL/min   GFR calc Af Amer >60 >60 mL/min    Comment: (NOTE) The eGFR has been calculated using the CKD EPI equation. This calculation has not been validated in all clinical situations. eGFR's persistently <60 mL/min signify possible Chronic Kidney Disease.    Anion gap 6 5 - 15  CBC     Status: Abnormal   Collection Time: 01/01/15  4:49 AM  Result Value Ref Range   WBC 8.0 3.6 - 11.0 K/uL   RBC 3.38 (L) 3.80 - 5.20 MIL/uL   Hemoglobin 10.3 (L) 12.0 - 16.0 g/dL   HCT 31.7 (L) 35.0 - 47.0 %   MCV 93.8 80.0 - 100.0 fL   MCH 30.5 26.0 - 34.0 pg   MCHC 32.5 32.0 - 36.0 g/dL   RDW 14.8 (H) 11.5 - 14.5 %   Platelets 157 150 - 440 K/uL  Comprehensive metabolic panel     Status: Abnormal   Collection Time: 01/02/15  5:28 AM  Result Value Ref Range   Sodium 134 (L) 135 - 145 mmol/L   Potassium 4.3 3.5 - 5.1 mmol/L   Chloride 103 101 - 111 mmol/L   CO2 26 22 - 32 mmol/L   Glucose, Bld 111 (H) 65 - 99 mg/dL   BUN 12 6 - 20 mg/dL   Creatinine, Ser 0.61 0.44 - 1.00 mg/dL   Calcium 7.9 (L) 8.9 - 10.3 mg/dL   Total Protein 5.4 (L) 6.5 - 8.1 g/dL   Albumin 2.7 (L) 3.5 - 5.0 g/dL   AST 20 15 - 41 U/L   ALT 12 (L) 14 - 54 U/L   Alkaline Phosphatase 36 (L) 38 - 126 U/L   Total Bilirubin 0.6 0.3 - 1.2 mg/dL   GFR calc non Af Amer >60 >60 mL/min   GFR calc Af Amer >60 >60 mL/min    Comment: (NOTE) The eGFR has been calculated using the CKD EPI equation. This calculation has not been validated in all clinical situations. eGFR's persistently <60 mL/min signify possible Chronic Kidney Disease.    Anion gap 5 5 - 15  CBC     Status: Abnormal   Collection Time: 01/02/15  5:28 AM  Result Value Ref Range   WBC 9.2 3.6 - 11.0 K/uL   RBC 3.13 (L) 3.80 - 5.20 MIL/uL   Hemoglobin 9.6 (L) 12.0 - 16.0 g/dL   HCT 29.4 (L) 35.0 -  47.0 %   MCV 93.8 80.0 - 100.0 fL   MCH 30.5 26.0 - 34.0 pg   MCHC 32.6 32.0 - 36.0 g/dL   RDW 14.5 11.5 - 14.5 %   Platelets  135 (L) 150 - 440 K/uL  Glucose, capillary     Status: None   Collection Time: 01/02/15  7:46 AM  Result Value Ref Range   Glucose-Capillary 95 70 - 99 mg/dL   Comment 1 Notify RN   Glucose, capillary     Status: Abnormal   Collection Time: 01/02/15 11:24 AM  Result Value Ref Range   Glucose-Capillary 101 (H) 70 - 99 mg/dL   Comment 1 Notify RN   Urinalysis complete, with microscopic [facility])     Status: Abnormal   Collection Time: 01/02/15 12:45 PM  Result Value Ref Range   Color, Urine YELLOW (A) YELLOW   APPearance CLEAR (A) CLEAR   Glucose, UA NEGATIVE NEGATIVE mg/dL   Bilirubin Urine NEGATIVE NEGATIVE   Ketones, ur NEGATIVE NEGATIVE mg/dL   Specific Gravity, Urine 1.016 1.005 - 1.030   Hgb urine dipstick NEGATIVE NEGATIVE   pH 5.0 5.0 - 8.0   Protein, ur NEGATIVE NEGATIVE mg/dL   Nitrite NEGATIVE NEGATIVE   Leukocytes, UA 3+ (A) NEGATIVE   RBC / HPF 6-30 0 - 5 RBC/hpf   WBC, UA TOO NUMEROUS TO COUNT 0 - 5 WBC/hpf   Bacteria, UA RARE (A) NONE SEEN   Squamous Epithelial / LPF 0-5 (A) NONE SEEN   WBC Clumps PRESENT    Mucous PRESENT    Hyaline Casts, UA PRESENT    No components found for: ESR, C REACTIVE PROTEIN MICRO:  IMAGING: No results found.  HISTORICAL MICRO/IMAGING  Assessment/Plan:  81 yo with dementia admitted with hip fracture and found to have abd groin rash.  This is atypical for scabies so I suspect due to recurrent scratching. However for infection control measures and to ensure no infection will treat with permetherin cream x 1.   She is not contagious after this treatment even if she had scabies which I do not think she has. Thank you for the consult.

## 2015-01-02 NOTE — Progress Notes (Signed)
[facility] - [place] at [facility]                                                                                                                                                                                            Patient Demographics   Madison Wallace, is a 81 y.o. female, DOB - [birth year], GBT:[id number]  Admit date - 12/31/2014   Admitting Physician Arnaldo Natal, MD  Outpatient Primary MD for the patient is No primary care provider on file.  LOS - 2  Chief Complaint  Patient presents with  . Fall      Pt noted to have scaly rash on abdomen  Review of Systems:  Unable to provide due to demetia    Vitals:   Filed Vitals:   01/02/15 0416 01/02/15 0744 01/02/15 0747 01/02/15 1122  BP: 104/46 93/36 102/52 124/48  Pulse: 77 71  35  Temp: 100.5 F (38.1 C) 97.4 F (36.3 C)    TempSrc: Oral Oral    Resp: Height:      Weight:      SpO2: 91% 91%  97%    Wt Readings from Last 3 Encounters:  12/31/14 53.4 kg (117 lb 11.6 oz)     Intake/Output Summary (Last 24 hours) at 01/02/15 1407 Last data filed at 01/02/15 0900  Gross per 24 hour  Intake 2149.25 ml  Output    425 ml  Net 1724.25 ml    Physical Exam:   GENERAL: Pleasant-appearing in no apparent distress.  HEAD, EYES, EARS, NOSE AND THROAT: Atraumatic, normocephalic. Extraocular muscles are intact. Pupils equal and reactive to light. Sclerae anicteric. No conjunctival injection. No oro-pharyngeal erythema.  NECK: Supple. There is no jugular venous distention. No bruits, no lymphadenopathy, no thyromegaly.  HEART: Regular rate and rhythm, tachycardic. No murmurs, no rubs, no clicks.  LUNGS: Clear to auscultation bilaterally. No rales or rhonchi. No wheezes.  ABDOMEN: Soft, flat, nontender, nondistended. Has good bowel sounds. No hepatosplenomegaly appreciated.  EXTREMITIES: No evidence of any cyanosis, clubbing, or peripheral edema.  +2 pedal and radial pulses  bilaterally.  NEUROLOGIC: The patient is alert, awake,  No focal defecits, anxious SKIN: scaly rash on abdomen     Antibiotics   Anti-infectives    Start     Dose/Rate Route Frequency Ordered Stop   01/02/15 1245  ivermectin (STROMECTOL) tablet 10,500 mcg     200 mcg/kg  53.4 kg Oral  Once 01/02/15 1238     12/31/14 1445  ceFAZolin (ANCEF) IVPB 1 g/50 mL premix     1 g 100 mL/hr over  30 Minutes Intravenous Every 6 hours 12/31/14 1433 01/01/15 0354   12/31/14 1115  cefTRIAXone (ROCEPHIN) 1 g in dextrose 5 % 50 mL IVPB - Premix     1 g 100 mL/hr over 30 Minutes Intravenous Every 24 hours 12/31/14 1100        Medications   Scheduled Meds: . acetaminophen  500 mg Oral QHS  . busPIRone  15 mg Oral TID  . cefTRIAXone (ROCEPHIN)  IV  1 g Intravenous Q24H  . docusate sodium  100 mg Oral BID  . donepezil  10 mg Oral QHS  . enoxaparin (LOVENOX) injection  30 mg Subcutaneous Q24H  . hydrocortisone cream   Topical BID  . ivermectin  200 mcg/kg Oral Once  . levothyroxine  25 mcg Oral QAC breakfast  . multivitamin-lutein  1 capsule Oral Daily  . nystatin cream  1 application Topical BID  . psyllium  1 packet Oral BID  . sertraline  100 mg Oral Daily  . traMADol  50-100 mg Oral 4 times per day  . vitamin B-12  1,000 mcg Oral Daily   Continuous Infusions: . dextrose 5 % and 0.9 % NaCl with KCl 20 mEq/L 20 mL/hr at 01/02/15 0406   PRN Meds:.acetaminophen **OR** acetaminophen, bisacodyl, calamine, fentaNYL (SUBLIMAZE) injection, HYDROmorphone (DILAUDID) injection, LORazepam, metoCLOPramide **OR** metoCLOPramide (REGLAN) injection, morphine injection, ondansetron **OR** ondansetron (ZOFRAN) IV, oxyCODONE, polyethylene glycol, senna-docusate   Data Review:   Micro Results No results found for this or any previous visit (from the past 240 hour(s)).  Radiology Reports Dg Pelvis Portable  12/31/2014   CLINICAL DATA:  RIGHT hip fracture.  ORIF.  EXAM: DG C-ARM 1-60 MIN - NRPT [facility];  PORTABLE PELVIS 1-2 VIEWS  COMPARISON:  12/31/2014.  FINDINGS: RIGHT hip gamma nail fixation is present. No complicating features. Distal interlocking screw is present. Four intraoperative fluoroscopic spot films are submitted for interpretation.  IMPRESSION: RIGHT hip gamma nail fixation.   Electronically Signed   By: Andreas NewportGeoffrey  Lamke M.D.   On: 12/31/2014 13:46   Dg Chest Portable 1 View  12/31/2014   CLINICAL DATA:  Preoperative  EXAM: PORTABLE CHEST - 1 VIEW  COMPARISON:  01/06/2013  FINDINGS: There is marked unchanged cardiomegaly and mild aortic tortuosity. The lungs are grossly clear. There is no large effusion. Pulmonary vasculature is normal.  IMPRESSION: Cardiomegaly.  No acute findings.   Electronically Signed   By: Ellery Plunkaniel R Mitchell M.D.   On: 12/31/2014 03:35   Dg C-arm 1-60 Min-no Report  12/31/2014   CLINICAL DATA: right hip fracture   C-ARM 1-60 MINUTES  Fluoroscopy was utilized by the requesting physician.  No radiographic  interpretation.    Dg Hip Port Unilat With Pelvis 1v Right  12/31/2014   CLINICAL DATA:  Larey SeatFell at home.  Initial encounter.  EXAM: RIGHT HIP (WITH PELVIS) 1 VIEW PORTABLE  COMPARISON:  None.  FINDINGS: There is an intertrochanteric right hip fracture with varus angulation. There is no dislocation. No bone lesion or bony destruction is evident.  IMPRESSION: Intertrochanteric right hip fracture   Electronically Signed   By: Ellery Plunkaniel R Mitchell M.D.   On: 12/31/2014 03:16   Dg Hip Operative Unilat With Pelvis Right  12/31/2014   CLINICAL DATA:  Operative fixation of a right hip intertrochanteric fracture.  EXAM: OPERATIVE RIGHT HIP (WITH PELVIS IF PERFORMED) 4 VIEWS  TECHNIQUE: Fluoroscopic spot image(s) were submitted for interpretation post-operatively.  FLUOROSCOPY TIME:  Radiation Exposure Index (as provided by the fluoroscopic device): Not applicable  If  the device does not provide the exposure index:  Fluoroscopy Time:  0 min 55 seconds  Number of Acquired Images:  4   COMPARISON:  Radiographs obtained earlier today.  FINDINGS: Compression screw and rod fixation of the previously demonstrated right intertrochanteric fracture with anatomic position and alignment. No complicating features.  IMPRESSION: Hardware fixation of the previously seen right intertrochanteric fracture with anatomic position and alignment.   Electronically Signed   By: Beckie Salts M.D.   On: 12/31/2014 13:04     CBC  Recent Labs Lab 12/31/14 0220 12/31/14 0647 01/01/15 0449 01/02/15 0528  WBC 6.3 10.0 8.0 9.2  HGB 13.1 12.1 10.3* 9.6*  HCT 39.4 37.0 31.7* 29.4*  PLT 219 201 157 135*  MCV 92.9 92.8 93.8 93.8  MCH 30.8 30.5 30.5 30.5  MCHC 33.2 32.8 32.5 32.6  RDW 14.9* 14.6* 14.8* 14.5  LYMPHSABS 1.5  --   --   --   MONOABS 0.6  --   --   --   EOSABS 0.3  --   --   --   BASOSABS 0.1  --   --   --     Chemistries   Recent Labs Lab 12/31/14 0220 12/31/14 0647 01/01/15 0416 01/02/15 0528  NA 137  --  139 134*  K 3.8  --  3.9 4.3  CL 99*  --  106 103  CO2 30  --  27 26  GLUCOSE 107*  --  140* 111*  BUN 15  --  15 12  CREATININE 0.74 0.65 0.74 0.61  CALCIUM 9.1  --  8.0* 7.9*  AST 21  --  17 20  ALT 18  --  14 12*  ALKPHOS 53  --  38 36*  BILITOT 0.5  --  0.6 0.6   ------------------------------------------------------------------------------------------------------------------ estimated creatinine clearance is 35.5 mL/min (by C-G formula based on Cr of 0.61). ------------------------------------------------------------------------------------------------------------------ No results for input(s): HGBA1C in the last 72 hours. ------------------------------------------------------------------------------------------------------------------ No results for input(s): CHOL, HDL, LDLCALC, TRIG, CHOLHDL, LDLDIRECT in the last 72 hours. ------------------------------------------------------------------------------------------------------------------ No results for  input(s): TSH, T4TOTAL, T3FREE, THYROIDAB in the last 72 hours.  Invalid input(s): FREET3 ------------------------------------------------------------------------------------------------------------------ No results for input(s): VITAMINB12, FOLATE, FERRITIN, TIBC, IRON, RETICCTPCT in the last 72 hours.  Coagulation profile  Recent Labs Lab 12/31/14 0220  INR 0.93    No results for input(s): DDIMER in the last 72 hours.  Cardiac Enzymes  Recent Labs Lab 12/31/14 0220  TROPONINI <0.03   ------------------------------------------------------------------------------------------------------------------ Invalid input(s): POCBNP    Assessment & Plan   Assessment/Plan This is a 81 year old female admitted for intertrochanteric fracture of the right femur. 1. Intertrochanteric fracture:  S/p repair , pt eval  2. Decubitus wound  wound care  3. Hypothyroidism: Continue Synthroid 4. Depression/anxiety: Continue buspirone and sertraline and anxiolytics as needed  5. Dementia: Alzheimer's type; stable. Continue Aricept 6. Suspected scabies- contact isolation, start Ivermetin, facilty will not take back I will ask id to evaluate for clearance 7. GI prophylaxis: None     Code Status Orders        Start     Ordered   12/31/14 1434  Do not attempt resuscitation (DNR)   Continuous    Question Answer Comment  In the event of cardiac or respiratory ARREST Do not call a "code blue"   In the event of cardiac or respiratory ARREST Do not perform Intubation, CPR, defibrillation or ACLS   In the event of cardiac or respiratory ARREST Use medication  by any route, position, wound care, and other measures to relive pain and suffering. May use oxygen, suction and manual treatment of airway obstruction as needed for comfort.      12/31/14 1433    Advance Directive Documentation        Most Recent Value   Type of Advance Directive  Healthcare Power of Attorney, Living will   Pre-existing  out of facility DNR order (yellow form or pink MOST form)     "MOST" Form in Place?                 Consults  ortho   DVT Prophylaxis  lovenox  Lab Results  Component Value Date   PLT 135* 01/02/2015     Time Spent in minutes  25min  Auburn BilberryPATEL, Mutasim Tuckey M.D on 01/02/2015 at 2:07 PM  Between 7am to 6pm - Pager - [phone]  After 6pm go to [email or web address] - password [email or web address] [facility]  [facility]   Office  [phone]

## 2015-01-03 LAB — COMPREHENSIVE METABOLIC PANEL
ALT: 12 U/L — AB (ref 14–54)
AST: 19 U/L (ref 15–41)
Albumin: 2.5 g/dL — ABNORMAL LOW (ref 3.5–5.0)
Alkaline Phosphatase: 34 U/L — ABNORMAL LOW (ref 38–126)
Anion gap: 4 — ABNORMAL LOW (ref 5–15)
BUN: 16 mg/dL (ref 6–20)
CALCIUM: 7.8 mg/dL — AB (ref 8.9–10.3)
CO2: 26 mmol/L (ref 22–32)
CREATININE: 0.64 mg/dL (ref 0.44–1.00)
Chloride: 104 mmol/L (ref 101–111)
GFR calc Af Amer: >60 mL/min (ref >60)
GLUCOSE: 110 mg/dL — AB (ref 65–99)
Potassium: 4.5 mmol/L (ref 3.5–5.1)
SODIUM: 134 mmol/L — AB (ref 135–145)
Total Bilirubin: 0.5 mg/dL (ref 0.3–1.2)
Total Protein: 5.2 g/dL — ABNORMAL LOW (ref 6.5–8.1)

## 2015-01-03 LAB — GLUCOSE, CAPILLARY
Glucose-Capillary: 126 mg/dL — ABNORMAL HIGH (ref 70–99)
Glucose-Capillary: 114 mg/dL — ABNORMAL HIGH (ref 70–99)

## 2015-01-03 MED ORDER — LORAZEPAM 0.5 MG PO TABS: 0.2500 mg | ORAL_TABLET | Freq: Two times a day (BID) | ORAL | Status: DC | PRN

## 2015-01-03 MED ORDER — OXYCODONE HCL 5 MG PO TABS: 5.0000 mg | ORAL_TABLET | Freq: Four times a day (QID) | ORAL | Status: DC | PRN

## 2015-01-03 MED ORDER — CIPROFLOXACIN HCL 500 MG PO TABS
500.0000 mg | ORAL_TABLET | Freq: Two times a day (BID) | ORAL | Status: DC
Start: 2015-01-03 — End: 2015-01-08

## 2015-01-03 MED ORDER — ENOXAPARIN SODIUM 40 MG/0.4ML ~~LOC~~ SOLN: 40.0000 mg | SUBCUTANEOUS | Status: DC

## 2015-01-03 MED ORDER — CALAMINE EX LOTN: TOPICAL_LOTION | Freq: Two times a day (BID) | CUTANEOUS | Status: AC | PRN

## 2015-01-03 MED ORDER — LORAZEPAM 0.5 MG PO TABS: 0.2500 mg | ORAL_TABLET | Freq: Every day | ORAL | Status: AC

## 2015-01-03 MED ORDER — TRAMADOL HCL 50 MG PO TABS: 50.0000 mg | ORAL_TABLET | Freq: Four times a day (QID) | ORAL | Status: AC | PRN

## 2015-01-03 NOTE — Progress Notes (Signed)
  Subjective: 3 Days Post-Op Procedure(s) (LRB): COMPRESSION HIP (Right) Patient reports pain as mild.   Patient seen in rounds with Dr. Joice LoftsPoggi. Patient is well, and has had no acute complaints or problems Plan is to go Home Place after hospital stay. Negative for chest pain and shortness of breath Fever: no Gastrointestinal:negative for nausea and vomiting  Objective: Vital signs in last 24 hours: Temp:  [96.7 F (35.9 C)-100.1 F (37.8 C)] 96.7 F (35.9 C) (05/11 0517) Pulse Rate:  [35-100] 81 (05/11 0517) Resp:  [18] 18 (05/11 0517) BP: (93-152)/(36-68) 94/58 mmHg (05/11 0517) SpO2:  [91 %-97 %] 92 % (05/11 0517)  Intake/Output from previous day:  Intake/Output Summary (Last 24 hours) at 01/03/15 0622 Last data filed at 01/03/15 0314  Gross per 24 hour  Intake 857.84 ml  Output   1150 ml  Net -292.16 ml    Intake/Output this shift: Total I/O In: 303.5 [I.V.:303.5] Out: 1150 [Urine:1150]  Labs:  Recent Labs  12/31/14 0647 01/01/15 0449 01/02/15 0528  HGB 12.1 10.3* 9.6*    Recent Labs  01/01/15 0449 01/02/15 0528  WBC 8.0 9.2  RBC 3.38* 3.13*  HCT 31.7* 29.4*  PLT 157 135*    Recent Labs  01/01/15 0416 01/02/15 0528  NA 139 134*  K 3.9 4.3  CL 106 103  CO2 27 26  BUN 15 12  CREATININE 0.74 0.61  GLUCOSE 140* 111*  CALCIUM 8.0* 7.9*   No results for input(s): LABPT, INR in the last 72 hours.   EXAM General - Patient is Alert and Confused Extremity - Neurovascular intact Dorsiflexion/Plantar flexion intact Dressing/Incision - clean, dry Motor Function - intact, moving foot and toes well on exam.   Past Medical History  Diagnosis Date  . Dementia   . Generalized muscle weakness   . Diverticulosis   . Anxiety   . Hypothyroidism   . Diabetes mellitus without complication     Diet Controlled  . GERD (gastroesophageal reflux disease)     Assessment/Plan: 3 Days Post-Op Procedure(s) (LRB): COMPRESSION HIP (Right) Active  Problems:   Intertrochanteric fracture of right femur  Estimated body mass index is 19.59 kg/(m^2) as calculated from the following:   Height as of this encounter: 5\' 5"  (1.651 m).   Weight as of this encounter: 53.4 kg (117 lb 11.6 oz). Up with therapy and possible Discharge to Home Place   DVT Prophylaxis - Lovenox, Foot Pumps and TED hose Weight-Bearing as tolerated to Right leg  Dedra Skeensodd Xion Debruyne, PA-C Orthopaedic Surgery 01/03/2015, 6:22 AM

## 2015-01-03 NOTE — Discharge Instructions (Signed)
°  DIET:  Cardiac diet  DISCHARGE CONDITION:  Good  ACTIVITY:  Activity as tolerated, pt eval and treat  OXYGEN:  Home Oxygen: No.   Oxygen Delivery: room air  DISCHARGE LOCATION:  assited living facility   ** Keep foley in until revaluated with primary md  If you experience worsening of your admission symptoms, develop shortness of breath, life threatening emergency, suicidal or homicidal thoughts you must seek medical attention immediately by calling 911 or calling your MD immediately  if symptoms less severe.  You Must read complete instructions/literature along with all the possible adverse reactions/side effects for all the Medicines you take and that have been prescribed to you. Take any new Medicines after you have completely understood and accpet all the possible adverse reactions/side effects.   Please note  You were cared for by a hospitalist during your hospital stay. If you have any questions about your discharge medications or the care you received while you were in the hospital after you are discharged, you can call the unit and asked to speak with the hospitalist on call if the hospitalist that took care of you is not available. Once you are discharged, your primary care physician will handle any further medical issues. Please note that NO REFILLS for any discharge medications will be authorized once you are discharged, as it is imperative that you return to your primary care physician (or establish a relationship with a primary care physician if you do not have one) for your aftercare needs so that they can reassess your need for medications and monitor your lab values.

## 2015-01-03 NOTE — Care Management (Signed)
  Patient suffers from dementia, hip fracture which impairs their ability to perform daily  activities like toileting, feeding, dressing, grooming, bathing in the home. A cane, walker, crutch will not resolve  issue with performing activities of daily living. A wheelchair will allow patient to safely perform daily activities.  Patient can safely propel the wheelchair in the home or has a caregiver who can provide assistance.  Wheelchair ordered through [facility].  EMS may be called when [facility] delivers hospital bed to [facility].

## 2015-01-03 NOTE — Discharge Summary (Addendum)
Madison Wallace, [place] y[place] DOB 21-Jul-1917, MRN [id number]. Admission date: 12/31/2014 Discharge Date 01/03/2015 Primary MD No primary care provider on file. Admitting Physician Arnaldo Natal, MD  Admission Diagnosis  Fall at home, initial encounter [phone], Y92.099] Fracture, intertrochanteric, right femur, closed, initial encounter [S72.141A] Dementia, without behavioral disturbance [F03.90]  Discharge Diagnosis   Active Problems:   Intertrochanteric fracture of right femur     Past Medical History  Diagnosis Date  . Dementia   . Generalized muscle weakness   . Diverticulosis   . Anxiety   . Hypothyroidism   . Diabetes mellitus without complication     Diet Controlled  . GERD (gastroesophageal reflux disease)     Past Surgical History  Procedure Laterality Date  . Compression hip screw Right 12/31/2014    Procedure: COMPRESSION HIP;  Surgeon: Christena Flake, MD;  Location: [facility] ORS;  Service: Orthopedics;  Laterality: Right;      Hospital Course See H&P, Labs, Consult and Test reports for all details in brief, patient was admitted for after fall unable to get up.  Pt was evaluated in ed noted to have right hip fracture. She was seen by ortho and had reduction and internal fixation of right hip fracture with Biomet Affyxis TFN nail. Pt due to her dementia had some confusion and agitation, but now improved. She also has chronic rash on abdomen, there was some concern abut scabies was seen by ID they didn't feel she had scabies. At this point she is stable to d/c back to assited living.   Active Problems:   Intertrochanteric fracture of right femur    Consults  orthopedic surgery, infectious disase  Significant Tests:  See full reports for all details      Dg Pelvis Portable  12/31/2014   CLINICAL DATA:  RIGHT hip fracture.  ORIF.  EXAM: DG C-ARM 1-60 MIN - NRPT [facility]; PORTABLE PELVIS 1-2 VIEWS  COMPARISON:  12/31/2014.  FINDINGS: RIGHT hip gamma nail fixation is present. No  complicating features. Distal interlocking screw is present. Four intraoperative fluoroscopic spot films are submitted for interpretation.  IMPRESSION: RIGHT hip gamma nail fixation.   Electronically Signed   By: Andreas Newport M.D.   On: 12/31/2014 13:46   Dg Chest Portable 1 View  12/31/2014   CLINICAL DATA:  Preoperative  EXAM: PORTABLE CHEST - 1 VIEW  COMPARISON:  01/06/2013  FINDINGS: There is marked unchanged cardiomegaly and mild aortic tortuosity. The lungs are grossly clear. There is no large effusion. Pulmonary vasculature is normal.  IMPRESSION: Cardiomegaly.  No acute findings.   Electronically Signed   By: Ellery Plunk M.D.   On: 12/31/2014 03:35   Dg C-arm 1-60 Min-no Report  12/31/2014   CLINICAL DATA: right hip fracture   C-ARM 1-60 MINUTES  Fluoroscopy was utilized by the requesting physician.  No radiographic  interpretation.    Dg Hip Port Unilat With Pelvis 1v Right  12/31/2014   CLINICAL DATA:  Larey Seat at home.  Initial encounter.  EXAM: RIGHT HIP (WITH PELVIS) 1 VIEW PORTABLE  COMPARISON:  None.  FINDINGS: There is an intertrochanteric right hip fracture with varus angulation. There is no dislocation. No bone lesion or bony destruction is evident.  IMPRESSION: Intertrochanteric right hip fracture   Electronically Signed   By: Ellery Plunk M.D.   On: 12/31/2014 03:16   Dg Hip Operative Unilat With Pelvis Right  12/31/2014   CLINICAL DATA:  Operative fixation of a right hip intertrochanteric fracture.  EXAM: OPERATIVE  RIGHT HIP (WITH PELVIS IF PERFORMED) 4 VIEWS  TECHNIQUE: Fluoroscopic spot image(s) were submitted for interpretation post-operatively.  FLUOROSCOPY TIME:  Radiation Exposure Index (as provided by the fluoroscopic device): Not applicable  If the device does not provide the exposure index:  Fluoroscopy Time:  0 min 55 seconds  Number of Acquired Images:  4  COMPARISON:  Radiographs obtained earlier today.  FINDINGS: Compression screw and rod fixation of the previously  demonstrated right intertrochanteric fracture with anatomic position and alignment. No complicating features.  IMPRESSION: Hardware fixation of the previously seen right intertrochanteric fracture with anatomic position and alignment.   Electronically Signed   By: Beckie Salts M.D.   On: 12/31/2014 13:04       Today   Subjective:   Madison Wallace  Confused and at basline  Objective:   Blood pressure 108/44, pulse 80, temperature 98 F (36.7 C), temperature source Oral, resp. rate 16, height  (1.651 m), weight 53.4 kg (117 lb 11.6 oz), SpO2 99 %.  .  Intake/Output Summary (Last 24 hours) at 01/03/15 1100 Last data filed at 01/03/15 0948  Gross per 24 hour  Intake 977.84 ml  Output   1150 ml  Net -172.16 ml    Exam VITAL SIGNS: Blood pressure 108/44, pulse 80, temperature 98 F (36.7 C), temperature source Oral, resp. rate 16, height  (1.651 m), weight 53.4 kg (117 lb 11.6 oz), SpO2 99 %.  GENERAL:  81 y.o.-year-old patient lying in the bed with no acute distress.  EYES: Pupils equal, round, reactive to light and accommodation. No scleral icterus. Extraocular muscles intact.  HEENT: Head atraumatic, normocephalic. Oropharynx and nasopharynx clear.  NECK:  Supple, no jugular venous distention. No thyroid enlargement, no tenderness.  LUNGS: Normal breath sounds bilaterally, no wheezing, rales,rhonchi or crepitation. No use of accessory muscles of respiration.  CARDIOVASCULAR: S1, S2 normal. No murmurs, rubs, or gallops.  ABDOMEN: Soft, nontender, nondistended. Bowel sounds present. No organomegaly or mass.  EXTREMITIES: No pedal edema, cyanosis, or clubbing.  NEUROLOGIC: Cranial nerves II through XII are intact. Muscle strength 5/5 in all extremities. Sensation intact. Gait not checked.  PSYCHIATRIC:confused at baseline SKIN: No obvious rash, lesion, or ulcer.   Data Review   Cultures -    CBC w Diff: Lab Results  Component Value Date   WBC 9.2 01/02/2015   WBC  7.6 06/04/2014   HGB 9.6* 01/02/2015   HGB 12.4 06/04/2014   HCT 29.4* 01/02/2015   HCT 37.9 06/04/2014   PLT 135* 01/02/2015   PLT 165 06/04/2014   LYMPHOPCT 23 12/31/2014   LYMPHOPCT 8.2 06/04/2014   MONOPCT 10 12/31/2014   MONOPCT 9.9 06/04/2014   EOSPCT 4 12/31/2014   EOSPCT 2.6 06/04/2014   BASOPCT 1 12/31/2014   BASOPCT 0.5 06/04/2014   CMP: Lab Results  Component Value Date   NA 134* 01/03/2015   NA 136 06/01/2014   K 4.5 01/03/2015   K 3.9 06/01/2014   CL 104 01/03/2015   CL 100 06/01/2014   CO2 26 01/03/2015   CO2 28 06/01/2014   BUN 16 01/03/2015   BUN 14 06/01/2014   CREATININE 0.64 01/03/2015   CREATININE 0.82 06/01/2014   PROT 5.2* 01/03/2015   PROT 6.7 06/01/2014   ALBUMIN 2.5* 01/03/2015   ALBUMIN 3.2* 06/01/2014   BILITOT 0.5 01/03/2015   ALKPHOS 34* 01/03/2015   ALKPHOS 42* 06/01/2014   AST 19 01/03/2015   AST 26 06/01/2014   ALT 12* 01/03/2015  ALT 22 06/01/2014  .  Micro Results No results found for this or any previous visit (from the past 240 hour(s)).   Discharge Instructions        Follow-up Information    Follow up with Christena FlakeJohn J Poggi, MD On 01/24/2015.   Specialty:  Surgery   Why:  Appointment is at 10:15. [facility] [place] [facility].   Contact information:   [address] [place] [phone]       Follow up with pcp In 7 days.   Why:  Follow up with doctor at nursing facility.      Discharge Medications     Medication List    TAKE these medications        acetaminophen 500 MG tablet  Commonly known as:  TYLENOL  Take 500 mg by mouth at bedtime.     acetaminophen 500 MG tablet  Commonly known as:  TYLENOL  Take 500 mg by mouth every 6 (six) hours as needed for mild pain, moderate pain or fever.     busPIRone 15 MG tablet  Commonly known as:  BUSPAR  Take 15 mg by mouth 3 (three) times daily.     calamine lotion  Apply topically 2 (two) times daily as needed  for itching (or dry skin).     ciprofloxacin 500 MG tablet  Commonly known as:  CIPRO  Take 1 tablet (500 mg total) by mouth 2 (two) times daily.     donepezil 10 MG tablet  Commonly known as:  ARICEPT  Take 10 mg by mouth at bedtime.     enoxaparin 40 MG/0.4ML injection  Commonly known as:  LOVENOX  Inject 0.4 mLs (40 mg total) into the skin daily.     hydrocortisone 2.5 % cream  Apply 1 application topically 2 (two) times daily. Apply 1g topically twice daily to back until resolved.     levothyroxine 25 MCG tablet  Commonly known as:  SYNTHROID, LEVOTHROID  Take 25 mcg by mouth daily.     LORazepam 0.5 MG tablet  Commonly known as:  ATIVAN  Take 0.5 tablets (0.25 mg total) by mouth at bedtime.     LORazepam 0.5 MG tablet  Commonly known as:  ATIVAN  Take 0.5 tablets (0.25 mg total) by mouth 2 (two) times daily as needed for anxiety.     nystatin cream  Commonly known as:  MYCOSTATIN  Apply 1 application topically 2 (two) times daily.     pantoprazole 40 MG tablet  Commonly known as:  PROTONIX  Take 40 mg by mouth daily.     polyethylene glycol packet  Commonly known as:  MIRALAX / GLYCOLAX  Take 17 g by mouth daily as needed (for constipation). Fill cap to 17gm mark, mix with 4-8 ounces of fluid and take by mouth every day as needed for constipation     Pramoxine-Calamine 1-3 % Lotn  Apply 1 application topically 2 (two) times daily as needed (for dry skin or itching.).     PRESERVISION AREDS 2 Caps  Take 1 capsule by mouth daily.     psyllium 0.52 G capsule  Commonly known as:  REGULOID  Take 2 capsules by mouth 2 (two) times daily.     sertraline 100 MG tablet  Commonly known as:  ZOLOFT  Take 100 mg by mouth daily.     traMADol 50 MG tablet  Commonly known as:  ULTRAM  Take 1 tablet (50 mg  total) by mouth every 6 (six) hours as needed.     triamcinolone cream 0.1 %  Commonly known as:  KENALOG  Apply 1 application topically 2 (two) times daily. Apply  a thin layer to affected area(s) to back of right hand topically twice daily.     vitamin B-12 1000 MCG tablet  Commonly known as:  CYANOCOBALAMIN  Take 1,000 mcg by mouth daily.         Total Time in preparing paper work, data evaluation and todays exam - 35 minutes  Auburn BilberryPATEL, Anie Juniel M.D on 01/03/2015 at 11:00 AM  [facility]   Office  [phone]

## 2015-01-03 NOTE — Discharge Summary (Deleted)
Madison Wallace, [place] y.o., DOB Dec 02, 1916, MRN [id number]. Admission date: 12/31/2014 Discharge Date 01/03/2015 Primary MD No primary care provider on file. Admitting Physician Madison Natal, MD  Admission Diagnosis  Fall at home, initial encounter [phone], Y92.099] Fracture, intertrochanteric, right femur, closed, initial encounter [S72.141A] Dementia, without behavioral disturbance [F03.90]  Discharge Diagnosis   Active Problems:   Intertrochanteric fracture of right femur      Past Medical History  Diagnosis Date  . Dementia   . Generalized muscle weakness   . Diverticulosis   . Anxiety   . Hypothyroidism   . Diabetes mellitus without complication     Diet Controlled  . GERD (gastroesophageal reflux disease)     Past Surgical History  Procedure Laterality Date  . Compression hip screw Right 12/31/2014    Procedure: COMPRESSION HIP;  Surgeon: Madison Flake, MD;  Location: [facility] ORS;  Service: Orthopedics;  Laterality: Right;      Hospital Course See H&P, Labs, Consult and Test reports for all details in brief, patient was admitted after fall unable to get up.  Pt was evaluated in ed noted to have right hip fracture. She was seen by ortho and had  reduction and internal fixation of right hip fracture with Biomet Affyxis TFN nail. Pt due to her dementia had some confusion and agitation, but now improved. She also has chronic rash on abdomen, there was some concern abut scabies was seen by ID they didn't feel she had scabies. At this point she is stable to d/c back to assited living.  Active Problems:   Intertrochanteric fracture of right femur    Consults  orthopedic surgery, infectious disease  Significant Tests:  See full reports for all details      Dg Pelvis Portable  12/31/2014   CLINICAL DATA:  RIGHT hip fracture.  ORIF.  EXAM: DG C-ARM 1-60 MIN - NRPT [facility]; PORTABLE PELVIS 1-2 VIEWS  COMPARISON:  12/31/2014.  FINDINGS: RIGHT hip gamma nail fixation is present. No  complicating features. Distal interlocking screw is present. Four intraoperative fluoroscopic spot films are submitted for interpretation.  IMPRESSION: RIGHT hip gamma nail fixation.   Electronically Signed   By: Madison Wallace M.D.   On: 12/31/2014 13:46   Dg Chest Portable 1 View  12/31/2014   CLINICAL DATA:  Preoperative  EXAM: PORTABLE CHEST - 1 VIEW  COMPARISON:  01/06/2013  FINDINGS: There is marked unchanged cardiomegaly and mild aortic tortuosity. The lungs are grossly clear. There is no large effusion. Pulmonary vasculature is normal.  IMPRESSION: Cardiomegaly.  No acute findings.   Electronically Signed   By: Madison Wallace M.D.   On: 12/31/2014 03:35   Dg C-arm 1-60 Min-no Report  12/31/2014   CLINICAL DATA: right hip fracture   C-ARM 1-60 MINUTES  Fluoroscopy was utilized by the requesting physician.  No radiographic  interpretation.    Dg Hip Port Unilat With Pelvis 1v Right  12/31/2014   CLINICAL DATA:  Larey Seat at home.  Initial encounter.  EXAM: RIGHT HIP (WITH PELVIS) 1 VIEW PORTABLE  COMPARISON:  None.  FINDINGS: There is an intertrochanteric right hip fracture with varus angulation. There is no dislocation. No bone lesion or bony destruction is evident.  IMPRESSION: Intertrochanteric right hip fracture   Electronically Signed   By: Madison Wallace M.D.   On: 12/31/2014 03:16   Dg Hip Operative Unilat With Pelvis Right  12/31/2014   CLINICAL DATA:  Operative fixation of a right hip intertrochanteric fracture.  EXAM: OPERATIVE  RIGHT HIP (WITH PELVIS IF PERFORMED) 4 VIEWS  TECHNIQUE: Fluoroscopic spot image(s) were submitted for interpretation post-operatively.  FLUOROSCOPY TIME:  Radiation Exposure Index (as provided by the fluoroscopic device): Not applicable  If the device does not provide the exposure index:  Fluoroscopy Time:  0 min 55 seconds  Number of Acquired Images:  4  COMPARISON:  Radiographs obtained earlier today.  FINDINGS: Compression screw and rod fixation of the previously  demonstrated right intertrochanteric fracture with anatomic position and alignment. No complicating features.  IMPRESSION: Hardware fixation of the previously seen right intertrochanteric fracture with anatomic position and alignment.   Electronically Signed   By: Madison Wallace M.D.   On: 12/31/2014 13:04       Today   Subjective:   Madison HoffMildred Wallace  Overall stable she has baseline dementia and is confused which is her baseline.   Objective:   Blood pressure 108/44, pulse 80, temperature 98 F (36.7 C), temperature source Oral, resp. rate 16, height 5\' 5"  (1.651 m), weight 53.4 kg (117 lb 11.6 oz), SpO2 99 %.  .  Intake/Output Summary (Last 24 hours) at 01/03/15 1017 Last data filed at 01/03/15 0948  Gross per 24 hour  Intake 977.84 ml  Output   1150 ml  Net -172.16 ml    Exam VITAL SIGNS: Blood pressure 108/44, pulse 80, temperature 98 F (36.7 C), temperature source Oral, resp. rate 16, height 5\' 5"  (1.651 m), weight 53.4 kg (117 lb 11.6 oz), SpO2 99 %.  GENERAL:  81 y.o.-year-old patient lying in the bed with no acute distress.  EYES: Pupils equal, round, reactive to light and accommodation. No scleral icterus. Extraocular muscles intact.  HEENT: Head atraumatic, normocephalic. Oropharynx and nasopharynx clear.  NECK:  Supple, no jugular venous distention. No thyroid enlargement, no tenderness.  LUNGS: Normal breath sounds bilaterally, no wheezing, rales,rhonchi or crepitation. No use of accessory muscles of respiration.  CARDIOVASCULAR: S1, S2 normal. No murmurs, rubs, or gallops.  ABDOMEN: Soft, nontender, nondistended. Bowel sounds present. No organomegaly or mass.  EXTREMITIES: No pedal edema, cyanosis, or clubbing.  NEUROLOGIC: Cranial nerves II through XII are intact. Muscle strength 5/5 in all extremities. Sensation intact. Gait not checked.  PSYCHIATRIC: confused SKIN: No obvious rash, lesion, or ulcer.   Data Review   Cultures -    CBC w Diff: Lab Results   Component Value Date   WBC 9.2 01/02/2015   WBC 7.6 06/04/2014   HGB 9.6* 01/02/2015   HGB 12.4 06/04/2014   HCT 29.4* 01/02/2015   HCT 37.9 06/04/2014   PLT 135* 01/02/2015   PLT 165 06/04/2014   LYMPHOPCT 23 12/31/2014   LYMPHOPCT 8.2 06/04/2014   MONOPCT 10 12/31/2014   MONOPCT 9.9 06/04/2014   EOSPCT 4 12/31/2014   EOSPCT 2.6 06/04/2014   BASOPCT 1 12/31/2014   BASOPCT 0.5 06/04/2014   CMP: Lab Results  Component Value Date   NA 134* 01/03/2015   NA 136 06/01/2014   K 4.5 01/03/2015   K 3.9 06/01/2014   CL 104 01/03/2015   CL 100 06/01/2014   CO2 26 01/03/2015   CO2 28 06/01/2014   BUN 16 01/03/2015   BUN 14 06/01/2014   CREATININE 0.64 01/03/2015   CREATININE 0.82 06/01/2014   PROT 5.2* 01/03/2015   PROT 6.7 06/01/2014   ALBUMIN 2.5* 01/03/2015   ALBUMIN 3.2* 06/01/2014   BILITOT 0.5 01/03/2015   ALKPHOS 34* 01/03/2015   ALKPHOS 42* 06/01/2014   AST 19 01/03/2015   AST  26 06/01/2014   ALT 12* 01/03/2015   ALT 22 06/01/2014  .  Micro Results No results found for this or any previous visit (from the past 240 hour(s)).   Discharge Instructions        Follow-up Information    Follow up with Madison FlakeJohn J Poggi, MD. Schedule an appointment as soon as possible for a visit in 2 weeks.   Specialty:  Surgery   Why:  For suture removal, For wound re-check   Contact information:   [address] [place] [phone]       Follow up with pcp In 2 weeks.      Discharge Medications     Medication List    TAKE these medications        acetaminophen 500 MG tablet  Commonly known as:  TYLENOL  Take 500 mg by mouth at bedtime.     acetaminophen 500 MG tablet  Commonly known as:  TYLENOL  Take 500 mg by mouth every 6 (six) hours as needed for mild pain, moderate pain or fever.     busPIRone 15 MG tablet  Commonly known as:  BUSPAR  Take 15 mg by mouth 3 (three) times daily.     calamine lotion  Apply topically 2 (two) times  daily as needed for itching (or dry skin).     donepezil 10 MG tablet  Commonly known as:  ARICEPT  Take 10 mg by mouth at bedtime.     enoxaparin 30 MG/0.3ML injection  Commonly known as:  LOVENOX  Inject 0.3 mLs (30 mg total) into the skin every 12 (twelve) hours.     enoxaparin 40 MG/0.4ML injection  Commonly known as:  LOVENOX  Inject 0.4 mLs (40 mg total) into the skin daily.     hydrocortisone 2.5 % cream  Apply 1 application topically 2 (two) times daily. Apply 1g topically twice daily to back until resolved.     levothyroxine 25 MCG tablet  Commonly known as:  SYNTHROID, LEVOTHROID  Take 25 mcg by mouth daily.     LORazepam 0.5 MG tablet  Commonly known as:  ATIVAN  Take 0.5 tablets (0.25 mg total) by mouth at bedtime.     LORazepam 0.5 MG tablet  Commonly known as:  ATIVAN  Take 0.5 tablets (0.25 mg total) by mouth 2 (two) times daily as needed for anxiety.     nystatin cream  Commonly known as:  MYCOSTATIN  Apply 1 application topically 2 (two) times daily.     oxyCODONE 5 MG immediate release tablet  Commonly known as:  Oxy IR/ROXICODONE  Take 1 tablet (5 mg total) by mouth every 6 (six) hours as needed for breakthrough pain.     pantoprazole 40 MG tablet  Commonly known as:  PROTONIX  Take 40 mg by mouth daily.     polyethylene glycol packet  Commonly known as:  MIRALAX / GLYCOLAX  Take 17 g by mouth daily as needed (for constipation). Fill cap to 17gm mark, mix with 4-8 ounces of fluid and take by mouth every day as needed for constipation     Pramoxine-Calamine 1-3 % Lotn  Apply 1 application topically 2 (two) times daily as needed (for dry skin or itching.).     PRESERVISION AREDS 2 Caps  Take 1 capsule by mouth daily.     psyllium 0.52 G capsule  Commonly known as:  REGULOID  Take 2 capsules by mouth 2 (two) times daily.  sertraline 100 MG tablet  Commonly known as:  ZOLOFT  Take 100 mg by mouth daily.     traMADol 50 MG tablet  Commonly  known as:  ULTRAM  Take 1-2 tablets (50-100 mg total) by mouth every 6 (six) hours as needed.     triamcinolone cream 0.1 %  Commonly known as:  KENALOG  Apply 1 application topically 2 (two) times daily. Apply a thin layer to affected area(s) to back of right hand topically twice daily.     vitamin B-12 1000 MCG tablet  Commonly known as:  CYANOCOBALAMIN  Take 1,000 mcg by mouth daily.         Total Time in preparing paper work, data evaluation and todays exam - 35 minutes  Auburn Bilberry M.D on 01/03/2015 at 10:17 AM  [facility]   Office  [phone]

## 2015-01-03 NOTE — Progress Notes (Signed)
Physical Therapy Treatment Patient Details Name: Madison HoffMildred Wallace MRN: [id number] DOB: 1917-02-12 Today's Date: 01/03/2015    History of Present Illness Pt is a 81 y.o. female s/p unwitnessed fall walking to bathroom at ALF ([facility]) sustaining an intertrochanteric R hip fracture; pt s/p R hip gamma nail fixation 12/31/14.    PT Comments    Pt confused to situation and place. Repeatedly asks where she is and why. Daughter present and patient with explanation. Pt states no initially with therapy, but participates with encouragement. Requires continual cueing during exercises and emotional support due to anxiety/disorientation. Pt has current discharge orders to SNF.  Follow Up Recommendations  SNF     Equipment Recommendations  Rolling walker with 5" wheels    Recommendations for Other Services       Precautions / Restrictions Restrictions Weight Bearing Restrictions: No RLE Weight Bearing: Weight bearing as tolerated    Mobility  Bed Mobility                  Transfers                    Ambulation/Gait                 Stairs            Wheelchair Mobility    Modified Rankin (Stroke Patients Only)       Balance                                    Cognition Arousal/Alertness: Awake/alert Behavior During Therapy: Anxious Overall Cognitive Status: History of cognitive impairments - at baseline       Memory: Decreased recall of precautions;Decreased short-term memory              Exercises General Exercises - Lower Extremity Ankle Circles/Pumps: AROM;Both;20 reps;Supine (limited range) Short Arc Quad: AROM;Both;15 reps;Supine (with encouragement and visual target) Heel Slides: AAROM;Both;15 reps;Supine Hip ABduction/ADduction: AAROM;Both;15 reps;Supine    General Comments        Pertinent Vitals/Pain Pain Location: R thigh/knee Pain Descriptors / Indicators:  ("it hurts") Pain Intervention(s):  Limited activity within patient's tolerance (Daughter notes pt has had pain medications)    Home Living                      Prior Function            PT Goals (current goals can now be found in the care plan section) Progress towards PT goals: Progressing toward goals    Frequency  BID    PT Plan Current plan remains appropriate    Co-evaluation             End of Session   Activity Tolerance: Patient limited by pain (Limited by cognitive status) Patient left: in bed;with bed alarm set;with family/visitor present     Time: 1030-1050 PT Time Calculation (min) (ACUTE ONLY): 20 min  Charges:  $Therapeutic Exercise: 8-22 mins                    G Codes:      Kristeen MissHeidi Elizabeth Bishop 01/03/2015, 11:01 AM

## 2015-01-03 NOTE — Clinical Social Work Placement (Signed)
   CLINICAL SOCIAL WORK PLACEMENT  NOTE  Date:  01/03/2015  Patient Details  Name: Madison HoffMildred Wallace MRN: [id number] Date of Birth: 11-11-1916  Clinical Social Work is seeking post-discharge placement for this patient at the Assisted Living Facility level of care (*CSW will initial, date and re-position this form in  chart as items are completed):  Yes   Patient/family provided with [place] Clinical Social Work Department's list of facilities offering this level of care within the geographic area requested by the patient (or if unable, by the patient's family).  Yes   Patient/family informed of their freedom to choose among providers that offer the needed level of care, that participate in Medicare, Medicaid or managed care program needed by the patient, have an available bed and are willing to accept the patient.  Yes   Patient/family informed of 's ownership interest in [facility] and [facility], as well as of the fact that they are under no obligation to receive care at these facilities.  PASRR submitted to EDS on       PASRR number received on       Existing PASRR number confirmed on 01/01/15     FL2 transmitted to all facilities in geographic area requested by pt/family on 01/01/15     FL2 transmitted to all facilities within larger geographic area on       Patient informed that his/her managed care company has contracts with or will negotiate with certain facilities, including the following:        Yes   Patient/family informed of bed offers received.  Patient chooses bed at  [facility] )     Physician recommends and patient chooses bed at      Patient to be transferred to  [facility] ) on 01/03/15.  Patient to be transferred to facility by  [facility] )     Patient family notified on 01/03/15 of transfer.  Name of family member notified:   (Patient's daughter [name] )     PHYSICIAN Please sign DNR, Please sign FL2      Additional Comment:    _______________________________________________ Haig ProphetMorgan, Lesta Limbert G, LCSW 01/03/2015, 12:02 PM

## 2015-01-03 NOTE — Progress Notes (Signed)
Chaplain met with patient and caregiver the daughter, chaplain provided prayer and emotional support. Loralyn Freshwater D. Alroy Dust Wednesday 01-03-2015   01/03/15 1730  Clinical Encounter Type  Visited With Patient and family together  Visit Type Follow-up  Referral From Chaplain  Consult/Referral To Chaplain  Spiritual Encounters  Spiritual Needs Prayer;Emotional  Stress Factors  Patient Stress Factors Health changes  Family Stress Factors Health changes

## 2015-01-03 NOTE — Progress Notes (Signed)
Patient is medically stable for D/C back to [facility]. Per Victorino DikeJennifer LPN at [facility] patient can return with [facility] following, Lovenox injections for 14 days, and foley. Victorino DikeJennifer reported that LPN would provide Lovenox injections Monday through Friday. Patient's daughter [name] has agreed to get a family member to provide Lovenox injections over the weekends. Clinical Child psychotherapistocial Worker (CSW) prepared D/C packet and faxed D/C Summary, Ortho Instructions, FL2 and prescriptions [facility] [place]. RN Case Manager is arranging a hospital bed and wheel chair. RN will call report to Victorino DikeJennifer and arrange EMS once hospital bed has been delivered. Patient's daughter [name] is at bedside and aware of above. CSW also left message with Kendal HymenBonnie the Director of [facility] making her aware of above. Please reconsult if future social work needs arise. CSW signing off.   Jetta LoutBailey Morgan, LCSWA [phone]

## 2015-01-04 ENCOUNTER — Encounter: Payer: Self-pay | Admitting: *Deleted

## 2015-01-04 ENCOUNTER — Inpatient Hospital Stay
Admission: EM | Admit: 2015-01-04 | Discharge: 2015-01-08 | DRG: 308 | Disposition: A | Discharge status: Hospice | Payer: Medicare Other | Attending: Internal Medicine | Admitting: Internal Medicine

## 2015-01-04 ENCOUNTER — Emergency Department: Payer: Medicare Other

## 2015-01-04 DIAGNOSIS — J189 Pneumonia, unspecified organism: Secondary | ICD-10-CM | POA: Diagnosis present

## 2015-01-04 DIAGNOSIS — E039 Hypothyroidism, unspecified: Secondary | ICD-10-CM | POA: Diagnosis present

## 2015-01-04 DIAGNOSIS — R7989 Other specified abnormal findings of blood chemistry: Secondary | ICD-10-CM

## 2015-01-04 DIAGNOSIS — Z515 Encounter for palliative care: Secondary | ICD-10-CM

## 2015-01-04 DIAGNOSIS — F0390 Unspecified dementia without behavioral disturbance: Secondary | ICD-10-CM | POA: Diagnosis present

## 2015-01-04 DIAGNOSIS — E46 Unspecified protein-calorie malnutrition: Secondary | ICD-10-CM | POA: Diagnosis present

## 2015-01-04 DIAGNOSIS — Z66 Do not resuscitate: Secondary | ICD-10-CM | POA: Diagnosis present

## 2015-01-04 DIAGNOSIS — F419 Anxiety disorder, unspecified: Secondary | ICD-10-CM | POA: Diagnosis present

## 2015-01-04 DIAGNOSIS — E119 Type 2 diabetes mellitus without complications: Secondary | ICD-10-CM | POA: Diagnosis present

## 2015-01-04 DIAGNOSIS — L8992 Pressure ulcer of unspecified site, stage 2: Secondary | ICD-10-CM

## 2015-01-04 DIAGNOSIS — Z79899 Other long term (current) drug therapy: Secondary | ICD-10-CM

## 2015-01-04 DIAGNOSIS — Z7401 Bed confinement status: Secondary | ICD-10-CM

## 2015-01-04 DIAGNOSIS — I4891 Unspecified atrial fibrillation: Principal | ICD-10-CM | POA: Diagnosis present

## 2015-01-04 DIAGNOSIS — K219 Gastro-esophageal reflux disease without esophagitis: Secondary | ICD-10-CM | POA: Diagnosis present

## 2015-01-04 DIAGNOSIS — L299 Pruritus, unspecified: Secondary | ICD-10-CM | POA: Diagnosis present

## 2015-01-04 DIAGNOSIS — R32 Unspecified urinary incontinence: Secondary | ICD-10-CM | POA: Diagnosis present

## 2015-01-04 DIAGNOSIS — R21 Rash and other nonspecific skin eruption: Secondary | ICD-10-CM | POA: Diagnosis present

## 2015-01-04 DIAGNOSIS — R778 Other specified abnormalities of plasma proteins: Secondary | ICD-10-CM

## 2015-01-04 DIAGNOSIS — I10 Essential (primary) hypertension: Secondary | ICD-10-CM | POA: Diagnosis present

## 2015-01-04 DIAGNOSIS — H9190 Unspecified hearing loss, unspecified ear: Secondary | ICD-10-CM | POA: Diagnosis present

## 2015-01-04 DIAGNOSIS — K5790 Diverticulosis of intestine, part unspecified, without perforation or abscess without bleeding: Secondary | ICD-10-CM | POA: Diagnosis present

## 2015-01-04 DIAGNOSIS — L89152 Pressure ulcer of sacral region, stage 2: Secondary | ICD-10-CM | POA: Diagnosis present

## 2015-01-04 DIAGNOSIS — Z880 Allergy status to penicillin: Secondary | ICD-10-CM

## 2015-01-04 LAB — CBC
HCT: 30.5 % — ABNORMAL LOW (ref 35.0–47.0)
HEMATOCRIT: 29.4 % — AB (ref 35.0–47.0)
HEMOGLOBIN: 9.7 g/dL — AB (ref 12.0–16.0)
Hemoglobin: 10 g/dL — ABNORMAL LOW (ref 12.0–16.0)
MCH: 30.6 pg (ref 26.0–34.0)
MCH: 30.7 pg (ref 26.0–34.0)
MCHC: 32.8 g/dL (ref 32.0–36.0)
MCHC: 33.1 g/dL (ref 32.0–36.0)
MCV: 93.2 fL (ref 80.0–100.0)
MCV: 92.6 fL (ref 80.0–100.0)
PLATELETS: 188 10*3/uL (ref 150–440)
PLATELETS: 186 10*3/uL (ref 150–440)
RBC: 3.27 MIL/uL — ABNORMAL LOW (ref 3.80–5.20)
RBC: 3.18 MIL/uL — ABNORMAL LOW (ref 3.80–5.20)
RDW: 14.2 % (ref 11.5–14.5)
RDW: 14.4 % (ref 11.5–14.5)
WBC: 10.6 10*3/uL (ref 3.6–11.0)
WBC: 9.6 10*3/uL (ref 3.6–11.0)

## 2015-01-04 LAB — URINALYSIS COMPLETE WITH MICROSCOPIC (ARMC ONLY)
Bacteria, UA: NONE SEEN
Bilirubin Urine: NEGATIVE
Glucose, UA: NEGATIVE mg/dL
Ketones, ur: NEGATIVE mg/dL
Leukocytes, UA: NEGATIVE
Nitrite: NEGATIVE
PROTEIN: NEGATIVE mg/dL
Specific Gravity, Urine: 1.015 (ref 1.005–1.030)
pH: 5 (ref 5.0–8.0)

## 2015-01-04 LAB — COMPREHENSIVE METABOLIC PANEL
ALT: 13 U/L — AB (ref 14–54)
AST: 22 U/L (ref 15–41)
Albumin: 2.6 g/dL — ABNORMAL LOW (ref 3.5–5.0)
Alkaline Phosphatase: 36 U/L — ABNORMAL LOW (ref 38–126)
Anion gap: 5 (ref 5–15)
BUN: 17 mg/dL (ref 6–20)
CALCIUM: 8 mg/dL — AB (ref 8.9–10.3)
CO2: 27 mmol/L (ref 22–32)
Chloride: 102 mmol/L (ref 101–111)
Creatinine, Ser: 0.63 mg/dL (ref 0.44–1.00)
GFR calc non Af Amer: >60 mL/min (ref >60)
GLUCOSE: 103 mg/dL — AB (ref 65–99)
Potassium: 4.1 mmol/L (ref 3.5–5.1)
Sodium: 134 mmol/L — ABNORMAL LOW (ref 135–145)
TOTAL PROTEIN: 5.6 g/dL — AB (ref 6.5–8.1)
Total Bilirubin: 0.8 mg/dL (ref 0.3–1.2)

## 2015-01-04 LAB — URINE CULTURE: Culture: NO GROWTH

## 2015-01-04 LAB — TROPONIN I
TROPONIN I: 0.15 ng/mL — AB (ref <0.031)
Troponin I: 0.15 ng/mL — ABNORMAL HIGH (ref <0.031)

## 2015-01-04 MED ORDER — DILTIAZEM HCL 25 MG/5ML IV SOLN: 10.0000 mg | INTRAVENOUS | Status: DC | PRN

## 2015-01-04 MED ORDER — PSYLLIUM 95 % PO PACK
1.0000 | PACK | Freq: Two times a day (BID) | ORAL | Status: DC
  Administered 2015-01-05 – 2015-01-08 (×6): 1 via ORAL
  Filled 2015-01-04 (×8): qty 1

## 2015-01-04 MED ORDER — DIGOXIN 0.25 MG/ML IJ SOLN
INTRAMUSCULAR | Status: AC
  Administered 2015-01-04: 0.25 mg via INTRAVENOUS
  Filled 2015-01-04: qty 2

## 2015-01-04 MED ORDER — ONDANSETRON HCL 4 MG/2ML IJ SOLN: 4.0000 mg | Freq: Four times a day (QID) | INTRAMUSCULAR | Status: DC | PRN

## 2015-01-04 MED ORDER — DONEPEZIL HCL 5 MG PO TABS
10.0000 mg | ORAL_TABLET | Freq: Every day | ORAL | Status: DC
  Administered 2015-01-04 – 2015-01-07 (×3): 10 mg via ORAL
  Filled 2015-01-04 (×3): qty 2

## 2015-01-04 MED ORDER — SODIUM CHLORIDE 0.9 % IV SOLN
INTRAVENOUS | Status: DC
  Administered 2015-01-04 – 2015-01-05 (×2): via INTRAVENOUS

## 2015-01-04 MED ORDER — LEVOFLOXACIN IN D5W 250 MG/50ML IV SOLN
INTRAVENOUS | Status: AC
  Administered 2015-01-04: 250 mg via INTRAVENOUS
  Filled 2015-01-04: qty 50

## 2015-01-04 MED ORDER — LORAZEPAM 0.5 MG PO TABS
0.2500 mg | ORAL_TABLET | Freq: Two times a day (BID) | ORAL | Status: DC | PRN
  Administered 2015-01-05: 0.25 mg via ORAL
  Filled 2015-01-04: qty 1

## 2015-01-04 MED ORDER — ENOXAPARIN SODIUM 40 MG/0.4ML ~~LOC~~ SOLN: 40.0000 mg | SUBCUTANEOUS | Status: DC

## 2015-01-04 MED ORDER — ACETAMINOPHEN 325 MG PO TABS
650.0000 mg | ORAL_TABLET | Freq: Four times a day (QID) | ORAL | Status: DC | PRN
  Administered 2015-01-07: 650 mg via ORAL
  Filled 2015-01-04: qty 2

## 2015-01-04 MED ORDER — SERTRALINE HCL 25 MG PO TABS
100.0000 mg | ORAL_TABLET | Freq: Every day | ORAL | Status: DC
  Administered 2015-01-04 – 2015-01-08 (×5): 100 mg via ORAL
  Filled 2015-01-04 (×5): qty 4

## 2015-01-04 MED ORDER — TRAMADOL HCL 50 MG PO TABS: 50.0000 mg | ORAL_TABLET | Freq: Four times a day (QID) | ORAL | Status: DC | PRN

## 2015-01-04 MED ORDER — TRIAMCINOLONE ACETONIDE 0.1 % EX CREA
1.0000 "application " | TOPICAL_CREAM | Freq: Two times a day (BID) | CUTANEOUS | Status: DC
  Administered 2015-01-05 – 2015-01-08 (×7): 1 via TOPICAL
  Filled 2015-01-04: qty 15

## 2015-01-04 MED ORDER — VITAMIN B-12 1000 MCG PO TABS
1000.0000 ug | ORAL_TABLET | Freq: Every day | ORAL | Status: DC
  Administered 2015-01-04 – 2015-01-08 (×5): 1000 ug via ORAL
  Filled 2015-01-04 (×5): qty 1

## 2015-01-04 MED ORDER — LEVOFLOXACIN IN D5W 250 MG/50ML IV SOLN
250.0000 mg | INTRAVENOUS | Status: DC
  Administered 2015-01-04 – 2015-01-05 (×2): 250 mg via INTRAVENOUS
  Filled 2015-01-04: qty 50

## 2015-01-04 MED ORDER — ALUM & MAG HYDROXIDE-SIMETH 200-200-20 MG/5ML PO SUSP: 30.0000 mL | Freq: Four times a day (QID) | ORAL | Status: DC | PRN

## 2015-01-04 MED ORDER — ENOXAPARIN SODIUM 40 MG/0.4ML ~~LOC~~ SOLN
40.0000 mg | SUBCUTANEOUS | Status: DC
  Administered 2015-01-04 – 2015-01-07 (×4): 40 mg via SUBCUTANEOUS
  Filled 2015-01-04 (×4): qty 0.4

## 2015-01-04 MED ORDER — PANTOPRAZOLE SODIUM 40 MG PO TBEC
40.0000 mg | DELAYED_RELEASE_TABLET | Freq: Every day | ORAL | Status: DC
  Administered 2015-01-04 – 2015-01-08 (×5): 40 mg via ORAL
  Filled 2015-01-04 (×5): qty 1

## 2015-01-04 MED ORDER — CALAMINE EX LOTN
TOPICAL_LOTION | Freq: Two times a day (BID) | CUTANEOUS | Status: DC | PRN
Start: 2015-01-04 — End: 2015-01-08
  Filled 2015-01-04: qty 177

## 2015-01-04 MED ORDER — OCUVITE PO TABS
1.0000 | ORAL_TABLET | Freq: Every day | ORAL | Status: DC
  Administered 2015-01-04 – 2015-01-08 (×5): 1 via ORAL
  Filled 2015-01-04 (×7): qty 1

## 2015-01-04 MED ORDER — CIPROFLOXACIN HCL 500 MG PO TABS
500.0000 mg | ORAL_TABLET | Freq: Two times a day (BID) | ORAL | Status: DC
  Administered 2015-01-04 – 2015-01-08 (×7): 500 mg via ORAL
  Filled 2015-01-04 (×7): qty 1

## 2015-01-04 MED ORDER — POLYETHYLENE GLYCOL 3350 17 G PO PACK: 17.0000 g | PACK | Freq: Every day | ORAL | Status: DC | PRN

## 2015-01-04 MED ORDER — DIGOXIN 0.25 MG/ML IJ SOLN
0.2500 mg | Freq: Once | INTRAMUSCULAR | Status: AC
  Administered 2015-01-04: 0.25 mg via INTRAVENOUS

## 2015-01-04 MED ORDER — PRAMOXINE-CALAMINE 1-3 % EX LOTN: 1.0000 "application " | TOPICAL_LOTION | Freq: Two times a day (BID) | CUTANEOUS | Status: DC | PRN

## 2015-01-04 MED ORDER — SODIUM CHLORIDE 0.9 % IV SOLN
1000.0000 mL | Freq: Once | INTRAVENOUS | Status: AC
  Administered 2015-01-04: 1000 mL via INTRAVENOUS

## 2015-01-04 MED ORDER — ONDANSETRON HCL 4 MG PO TABS: 4.0000 mg | ORAL_TABLET | Freq: Four times a day (QID) | ORAL | Status: DC | PRN

## 2015-01-04 MED ORDER — HYDROCORTISONE 1 % EX CREA
1.0000 "application " | TOPICAL_CREAM | Freq: Two times a day (BID) | CUTANEOUS | Status: DC
  Administered 2015-01-05 – 2015-01-08 (×7): 1 via TOPICAL
  Filled 2015-01-04: qty 28

## 2015-01-04 MED ORDER — LEVOTHYROXINE SODIUM 25 MCG PO TABS
25.0000 ug | ORAL_TABLET | Freq: Every day | ORAL | Status: DC
Start: 2015-01-05 — End: 2015-01-08
  Administered 2015-01-05 – 2015-01-08 (×4): 25 ug via ORAL
  Filled 2015-01-04 (×4): qty 1

## 2015-01-04 MED ORDER — DILTIAZEM HCL 25 MG/5ML IV SOLN: 10.0000 mg | Freq: Four times a day (QID) | INTRAVENOUS | Status: DC | PRN

## 2015-01-04 MED ORDER — ACETAMINOPHEN 500 MG PO TABS: 500.0000 mg | ORAL_TABLET | Freq: Four times a day (QID) | ORAL | Status: DC | PRN

## 2015-01-04 MED ORDER — SODIUM CHLORIDE 0.9 % IJ SOLN
3.0000 mL | Freq: Two times a day (BID) | INTRAMUSCULAR | Status: DC
  Administered 2015-01-04: 3 mL via INTRAVENOUS

## 2015-01-04 MED ORDER — LORAZEPAM 0.5 MG PO TABS
0.2500 mg | ORAL_TABLET | Freq: Every day | ORAL | Status: DC
  Administered 2015-01-04 – 2015-01-07 (×3): 0.25 mg via ORAL
  Filled 2015-01-04 (×3): qty 1

## 2015-01-04 MED ORDER — ACETAMINOPHEN 650 MG RE SUPP: 650.0000 mg | Freq: Four times a day (QID) | RECTAL | Status: DC | PRN

## 2015-01-04 MED ORDER — ACETAMINOPHEN 500 MG PO TABS: 500.0000 mg | ORAL_TABLET | Freq: Every day | ORAL | Status: DC

## 2015-01-04 MED ORDER — BUSPIRONE HCL 5 MG PO TABS
15.0000 mg | ORAL_TABLET | Freq: Three times a day (TID) | ORAL | Status: DC
  Administered 2015-01-04 – 2015-01-08 (×10): 15 mg via ORAL
  Filled 2015-01-04 (×10): qty 3

## 2015-01-04 NOTE — ED Provider Notes (Signed)
[facility] Emergency Department Provider Note  ____________________________________________  Time seen: 1105  I have reviewed the triage vital signs and the nursing notes.  This history and physical is limited by the patient's dementia. The daughter or daughter-in-law is the primary historian.  HISTORY  Chief Complaint Rash and Cough  skin breakdown and itching. Increased agitation.    HPI Madison Wallace is a 81 y.o. female who just underwent a right hip repair this past Sunday and was in the hospital until yesterday. She was discharged in the hospital back to her usual care facility[facility]. This morning a cough was noted. There is not great detail available about this cough. It seems it improved after the patient was treated with some Ativan due to agitation and itching.  The primary concern for this patient is agitation and itching. She has a habitof scratching but it has gotten much worse in the past day. The daughter reports that the patient has been reaching around to her back and scratching vigorously. This is to the point that she has some skin and blood present under her fingernails. There is also report that she may have some skin breakdown in her sacral area. The daughter spoke with the staff at [facility]. [facility] uncomfortable keeping the patient they're currently and advised that she was best sent to the emergency department. The daughter did not support this decision fully.  Unrelated to the chief complaint, the patient is noted to be in atrial fibrillation with rapid ventricular response. According to the family this is a new situation. She has no history of atrial fibrillation.     Past Medical History  Diagnosis Date  . Dementia   . Generalized muscle weakness   . Diverticulosis   . Anxiety   . Hypothyroidism   . Diabetes mellitus without complication     Diet Controlled  . GERD (gastroesophageal reflux disease)      Patient Active Problem List   Diagnosis Date Noted  . Intertrochanteric fracture of right femur 12/31/2014    Past Surgical History  Procedure Laterality Date  . Compression hip screw Right 12/31/2014    Procedure: COMPRESSION HIP;  Surgeon: Christena Flake, MD;  Location: [facility] ORS;  Service: Orthopedics;  Laterality: Right;    Current Outpatient Rx  Name  Route  Sig  Dispense  Refill  . acetaminophen (TYLENOL) 500 MG tablet   Oral   Take 500 mg by mouth at bedtime.         Marland Kitchen acetaminophen (TYLENOL) 500 MG tablet   Oral   Take 500 mg by mouth every 6 (six) hours as needed for mild pain, moderate pain or fever.         . busPIRone (BUSPAR) 15 MG tablet   Oral   Take 15 mg by mouth 3 (three) times daily.         . calamine lotion   Topical   Apply topically 2 (two) times daily as needed for itching (or dry skin).   120 mL   0   . ciprofloxacin (CIPRO) 500 MG tablet   Oral   Take 1 tablet (500 mg total) by mouth 2 (two) times daily.   10 tablet   0   . donepezil (ARICEPT) 10 MG tablet   Oral   Take 10 mg by mouth at bedtime.         . enoxaparin (LOVENOX) 40 MG/0.4ML injection   Subcutaneous   Inject 0.4 mLs (  40 mg total) into the skin daily.   8.4 mL   0   . hydrocortisone 2.5 % cream   Topical   Apply 1 application topically 2 (two) times daily. Apply 1g topically twice daily to back until resolved.         . levothyroxine (SYNTHROID, LEVOTHROID) 25 MCG tablet   Oral   Take 25 mcg by mouth daily.         Marland Kitchen. LORazepam (ATIVAN) 0.5 MG tablet   Oral   Take 0.5 tablets (0.25 mg total) by mouth at bedtime.   30 tablet   0   . LORazepam (ATIVAN) 0.5 MG tablet   Oral   Take 0.5 tablets (0.25 mg total) by mouth 2 (two) times daily as needed for anxiety.   30 tablet   0   . Multiple Vitamins-Minerals (PRESERVISION AREDS 2) CAPS   Oral   Take 1 capsule by mouth daily.         Marland Kitchen. nystatin cream (MYCOSTATIN)   Topical   Apply 1 application  topically 2 (two) times daily.         . pantoprazole (PROTONIX) 40 MG tablet   Oral   Take 40 mg by mouth daily.         . polyethylene glycol (MIRALAX / GLYCOLAX) packet   Oral   Take 17 g by mouth daily as needed (for constipation). Fill cap to 17gm mark, mix with 4-8 ounces of fluid and take by mouth every day as needed for constipation         . Pramoxine-Calamine 1-3 % LOTN   Topical   Apply 1 application topically 2 (two) times daily as needed (for dry skin or itching.).         Marland Kitchen. psyllium (REGULOID) 0.52 G capsule   Oral   Take 2 capsules by mouth 2 (two) times daily.         . sertraline (ZOLOFT) 100 MG tablet   Oral   Take 100 mg by mouth daily.         . traMADol (ULTRAM) 50 MG tablet   Oral   Take 1 tablet (50 mg total) by mouth every 6 (six) hours as needed.   30 tablet   0   . triamcinolone cream (KENALOG) 0.1 %   Topical   Apply 1 application topically 2 (two) times daily. Apply a thin layer to affected area(s) to back of right hand topically twice daily.         . vitamin B-12 (CYANOCOBALAMIN) 1000 MCG tablet   Oral   Take 1,000 mcg by mouth daily.           Allergies Penicillins  History reviewed. No pertinent family history.  Social History History  Substance Use Topics  . Smoking status: Never Smoker   . Smokeless tobacco: Never Used  . Alcohol Use: No    Review of Systems Review of systems is limited due the patient's dementia and lack of communication. ____________________________________________   PHYSICAL EXAM:  VITAL SIGNS: ED Triage Vitals  Enc Vitals Group     BP 01/04/15 1055 96/61 mmHg     Pulse Rate 01/04/15 1055 79     Resp 01/04/15 1055 20     Temp 01/04/15 1055 98 F (36.7 C)     Temp Source 01/04/15 1055 Axillary     SpO2 01/04/15 1055 98 %     Weight 01/04/15 1055 120 lb (54.432 kg)  Height 01/04/15 1055  (1.651 m)     Head Cir --      Peak Flow --      Pain Score --      Pain Loc --       Pain Edu? --      Excl. in GC? --     Constitutional: Frail thin elderly female. She is alert and does interact. She is hard of hearing. She has some agitation and confusion. ENT   Head: Normocephalic and atraumatic.   Nose: No congestion/rhinnorhea.   Mouth/Throat: Mucous membranes are moist. Cardiovascular: Irregular heartbeat with a rate of 130. Respiratory: Normal respiratory effort without tachypnea. Breath sounds are clear and equal bilaterally. No wheezes/rales/rhonchi. Gastrointestinal: Soft and nontender. No distention.  Back: There are excoriated areas on her back. There is no musculoskeletal deformity of the back. Musculoskeletal: The patient has recently undergone a right hip repair. There is a dressing in place over the right lateral hip. She does have pain as we move the patient for further examination. It is difficult to gauge whether this pain is coming from the hip or simply from the agitation of movement. Neurologic: Hard of hearing. Confusion. Moves all 4 extremities. Skin:  Trouble excoriated areas on her back where she has been scratching. In addition to this there is a large patch of discoloration on her sacrum and Botox consistent with a first degree decubitus ulcer and an area of skin breakdown primarily on the right buttocks cheek and a little bit across the sacrum consistent with a second-degree breakdown. Psychiatric: Alert but confused. She is somewhat agitated. She keeps saying she wants to get back in bed. ____________________________________________    LABS (pertinent positives/negatives)  CBC shows mild anemia with a hemoglobin of 10. White blood cell count of 10.6. Electrolytes are overall okay with a slightly low sodium at 134. Troponin is notable at 0.15. Urinalysis shows red blood cells 6-30 but no white blood cells.  ____________________________________________   EKG  EKG at 11:49 AM shows H with fibrillation with rapid ventricular response  at a rate of 131. The QRS has a normal interval time and appears normal. No acute ST changes noted.  ____________________________________________    RADIOLOGY  Chest x-ray shows a mild left lower lobe atelectasis or infiltrate.  ____________________________________________   PROCEDURES  Procedure(s) performed: None  Critical Care performed: patient has new onset atrial fibrillation and rapid ventricular response with an elevated troponin. Local care time 35 minutes  ____________________________________________   INITIAL IMPRESSION / ASSESSMENT AND PLAN / ED COURSE  Unrelated to the patient's chief complaint, she appears to be a new onset atrial fibrillation. We will treat her with 10 mg of diltiazem and repeat as tolerated. Her blood pressure is moderately low with an initial systolic blood pressure of 94. Given his balance and atrial fibrillation with RVR and low blood pressure and the fact that this is new onset, she will likely need admission to the hospital, during which time she can get additional skin care for the skin breakdown and for the excoriations.  ----------------------------------------- 1:53 PM on 01/04/2015 -----------------------------------------  Heart rate is currently 125 blood pressure 93/61. We'll give a small fluid bolus and continuous 10 mg of diltiazem. We discussed the case with the [facility] hospitalist service for admission.  ____________________________________________   FINAL CLINICAL IMPRESSION(S) / ED DIAGNOSES  Final diagnoses:  Atrial fibrillation, new onset  Atrial fibrillation with RVR  Troponin I above reference range  Decubitus ulcer, stage II  Darien Ramusavid W Olamae Ferrara, MD 01/04/15 [id number]

## 2015-01-04 NOTE — ED Notes (Signed)
Troponin sent.

## 2015-01-04 NOTE — Care Management Note (Signed)
Case Management Note  Patient Details  Name: Madison HoffMildred Wallace MRN: [id number] Date of Birth: 09-27-1916  Subjective/Objective:          OBSV lette rto daughter [name], at ER bedside;  Copy to HIM          Action/Plan:   Expected Discharge Date:                  Expected Discharge Plan:     In-House Referral:     Discharge planning Services     Post Acute Care Choice:    Choice offered to:     DME Arranged:    DME Agency:     HH Arranged:    HH Agency:     Status of Service:     Medicare Important Message Given:    Date Medicare IM Given:    Medicare IM give by:    Date Additional Medicare IM Given:    Additional Medicare Important Message give by:     If discussed at Long Length of Stay Meetings, dates discussed:    Additional Comments:  [name], RN 01/04/2015, 2:30 PM

## 2015-01-04 NOTE — ED Notes (Signed)
Informed family of room assignment. No other needs at this time.

## 2015-01-04 NOTE — ED Notes (Addendum)
Patient resting in bed with family at bedside.

## 2015-01-04 NOTE — ED Notes (Addendum)
Patient has foley catheter to gravity that was present on admission. She also has a dressing on her right hip s/p surgery from previous admission

## 2015-01-04 NOTE — H&P (Cosign Needed)
[facility] - [place] at [facility]   PATIENT NAME: Madison Wallace    MR#:  [id number]  DATE OF BIRTH:  08-25-1917  DATE OF ADMISSION:  01/04/2015  PRIMARY CARE PHYSICIAN: No primary care provider on file.   REQUESTING/REFERRING PHYSICIAN:   CHIEF COMPLAINT:   Chief Complaint  Patient presents with  . Rash  . Cough    HISTORY OF PRESENT ILLNESS: Madison Wallace  is a 81 y.o. female with a known history of history of dementia who actually was here with a right hip fracture and was discharged to to assisted living with [facility] following her there. a per his daughter's request. She was offered rehabilitation but she elected not to have her mother go to rehabilitation. Patient got to rehabilitation and apparently was coughing last night didn't eat much. Patient overnight scratched herself on the back. And they called the doctors make a house call on call and they sent her to the ER. In the ER patient's heart rate was noted to be elevated in the 120s. Was noted to have atrial fibrillation. Patient has advanced dementia and unable to give any history her daughter is at the bedside.  PAST MEDICAL HISTORY:   Past Medical History  Diagnosis Date  . Dementia   . Generalized muscle weakness   . Diverticulosis   . Anxiety   . Hypothyroidism   . Diabetes mellitus without complication     Diet Controlled  . GERD (gastroesophageal reflux disease)     PAST SURGICAL HISTORY:  Past Surgical History  Procedure Laterality Date  . Compression hip screw Right 12/31/2014    Procedure: COMPRESSION HIP;  Surgeon: Christena Flake, MD;  Location: [facility] ORS;  Service: Orthopedics;  Laterality: Right;    SOCIAL HISTORY:  History  Substance Use Topics  . Smoking status: Never Smoker   . Smokeless tobacco: Never Used  . Alcohol Use: No    FAMILY HISTORY:  Family History  Problem Relation Age of Onset  . Hypertension Mother     DRUG ALLERGIES:  Allergies  Allergen  Reactions  . Penicillins Other (See Comments)    Unknown reaction    REVIEW OF SYSTEMS:  Unable to obtain due to patient's dementia  MEDICATIONS AT HOME:  Prior to Admission medications   Medication Sig Start Date End Date Taking? Authorizing Provider  acetaminophen (TYLENOL) 500 MG tablet Take 500 mg by mouth at bedtime.    Historical Provider, MD  acetaminophen (TYLENOL) 500 MG tablet Take 500 mg by mouth every 6 (six) hours as needed for mild pain, moderate pain or fever.    Historical Provider, MD  busPIRone (BUSPAR) 15 MG tablet Take 15 mg by mouth 3 (three) times daily.    Historical Provider, MD  calamine lotion Apply topically 2 (two) times daily as needed for itching (or dry skin). 01/03/15   Auburn Bilberry, MD  ciprofloxacin (CIPRO) 500 MG tablet Take 1 tablet (500 mg total) by mouth 2 (two) times daily. 01/03/15 01/08/15  Auburn Bilberry, MD  donepezil (ARICEPT) 10 MG tablet Take 10 mg by mouth at bedtime.    Historical Provider, MD  enoxaparin (LOVENOX) 40 MG/0.4ML injection Inject 0.4 mLs (40 mg total) into the skin daily. 01/03/15 01/17/15  Auburn Bilberry, MD  hydrocortisone 2.5 % cream Apply 1 application topically 2 (two) times daily. Apply 1g topically twice daily to back until resolved.    Historical Provider, MD  levothyroxine (SYNTHROID, LEVOTHROID) 25 MCG tablet Take 25 mcg by  mouth daily.    Historical Provider, MD  LORazepam (ATIVAN) 0.5 MG tablet Take 0.5 tablets (0.25 mg total) by mouth at bedtime. 01/03/15   Auburn BilberryShreyang Adina Puzzo, MD  LORazepam (ATIVAN) 0.5 MG tablet Take 0.5 tablets (0.25 mg total) by mouth 2 (two) times daily as needed for anxiety. 01/03/15   Auburn BilberryShreyang Vondell Babers, MD  Multiple Vitamins-Minerals (PRESERVISION AREDS 2) CAPS Take 1 capsule by mouth daily.    Historical Provider, MD  nystatin cream (MYCOSTATIN) Apply 1 application topically 2 (two) times daily.    Historical Provider, MD  pantoprazole (PROTONIX) 40 MG tablet Take 40 mg by mouth daily.    Historical  Provider, MD  polyethylene glycol (MIRALAX / GLYCOLAX) packet Take 17 g by mouth daily as needed (for constipation). Fill cap to 17gm mark, mix with 4-8 ounces of fluid and take by mouth every day as needed for constipation    Historical Provider, MD  Pramoxine-Calamine 1-3 % LOTN Apply 1 application topically 2 (two) times daily as needed (for dry skin or itching.).    Historical Provider, MD  psyllium (REGULOID) 0.52 G capsule Take 2 capsules by mouth 2 (two) times daily.    Historical Provider, MD  sertraline (ZOLOFT) 100 MG tablet Take 100 mg by mouth daily.    Historical Provider, MD  traMADol (ULTRAM) 50 MG tablet Take 1 tablet (50 mg total) by mouth every 6 (six) hours as needed. 01/03/15   Auburn BilberryShreyang Lalania Haseman, MD  triamcinolone cream (KENALOG) 0.1 % Apply 1 application topically 2 (two) times daily. Apply a thin layer to affected area(s) to back of right hand topically twice daily.    Historical Provider, MD  vitamin B-12 (CYANOCOBALAMIN) 1000 MCG tablet Take 1,000 mcg by mouth daily.    Historical Provider, MD      PHYSICAL EXAMINATION:   VITAL SIGNS: Blood pressure 102/64, pulse 138, temperature 98 F (36.7 C), temperature source Axillary, resp. rate 16, height 5\' 5"  (1.651 m), weight 54.432 kg (120 lb), SpO2 97 %.  GENERAL:  81 y.o.-year-old patient lying in the bed with no acute distress.  EYES: Pupils equal, round, reactive to light and accommodation. No scleral icterus. Extraocular muscles intact.  HEENT: Head atraumatic, normocephalic. Oropharynx and nasopharynx clear.  NECK:  Supple, no jugular venous distention. No thyroid enlargement, no tenderness.  LUNGS: Normal breath sounds bilaterally, no wheezing, rales,rhonchi or crepitation. No use of accessory muscles of respiration.  CARDIOVASCULAR: Irregularly irregular heart rhythm. No murmurs, rubs, or gallops.  ABDOMEN: Soft, nontender, nondistended. Bowel sounds present. No organomegaly or mass.  EXTREMITIES: No pedal edema,  cyanosis, or clubbing.  NEUROLOGIC: Cranial nerves II through XII are intact. Muscle strength 5/5 in all extremities. Sensation intact. Gait not checked.  PSYCHIATRIC: Patient appears anxious  SKIN: No obvious rash, lesion, or ulcer.   LABORATORY PANEL:   CBC  Recent Labs Lab 12/31/14 0220 12/31/14 0647 01/01/15 0449 01/02/15 0528 01/04/15 1155  WBC 6.3 10.0 8.0 9.2 10.6  HGB 13.1 12.1 10.3* 9.6* 10.0*  HCT 39.4 37.0 31.7* 29.4* 30.5*  PLT 219 201 157 135* 188  MCV 92.9 92.8 93.8 93.8 93.2  MCH 30.8 30.5 30.5 30.5 30.6  MCHC 33.2 32.8 32.5 32.6 32.8  RDW 14.9* 14.6* 14.8* 14.5 14.2  LYMPHSABS 1.5  --   --   --   --   MONOABS 0.6  --   --   --   --   EOSABS 0.3  --   --   --   --  BASOSABS 0.1  --   --   --   --    ------------------------------------------------------------------------------------------------------------------  Chemistries   Recent Labs Lab 12/31/14 0220 12/31/14 0647 01/01/15 0416 01/02/15 0528 01/03/15 0547 01/04/15 1155  NA 137  --  139 134* 134* 134*  K 3.8  --  3.9 4.3 4.5 4.1  CL 99*  --  106 103 104 102  CO2 30  --  27 26 26 27   GLUCOSE 107*  --  140* 111* 110* 103*  BUN 15  --  15 12 16 17   CREATININE 0.74 0.65 0.74 0.61 0.64 0.63  CALCIUM 9.1  --  8.0* 7.9* 7.8* 8.0*  AST 21  --  17 20 19 22   ALT 18  --  14 12* 12* 13*  ALKPHOS 53  --  38 36* 34* 36*  BILITOT 0.5  --  0.6 0.6 0.5 0.8   ------------------------------------------------------------------------------------------------------------------ estimated creatinine clearance is 36.1 mL/min (by C-G formula based on Cr of 0.63). ------------------------------------------------------------------------------------------------------------------ No results for input(s): TSH, T4TOTAL, T3FREE, THYROIDAB in the last 72 hours.  Invalid input(s): FREET3   Coagulation profile  Recent Labs Lab 12/31/14 0220  INR 0.93    ------------------------------------------------------------------------------------------------------------------- No results for input(s): DDIMER in the last 72 hours. -------------------------------------------------------------------------------------------------------------------  Cardiac Enzymes  Recent Labs Lab 12/31/14 0220 01/04/15 1155  TROPONINI <0.03 0.15*   ------------------------------------------------------------------------------------------------------------------ Invalid input(s): POCBNP  ---------------------------------------------------------------------------------------------------------------  Urinalysis    Component Value Date/Time   COLORURINE YELLOW* 01/04/2015 1155   APPEARANCEUR CLEAR* 01/04/2015 1155   LABSPEC 1.015 01/04/2015 1155   PHURINE 5.0 01/04/2015 1155   GLUCOSEU NEGATIVE 01/04/2015 1155   HGBUR 2+* 01/04/2015 1155   BILIRUBINUR NEGATIVE 01/04/2015 1155   KETONESUR NEGATIVE 01/04/2015 1155   PROTEINUR NEGATIVE 01/04/2015 1155   NITRITE NEGATIVE 01/04/2015 1155   LEUKOCYTESUR NEGATIVE 01/04/2015 1155     RADIOLOGY: Dg Chest Portable 1 View  01/04/2015   CLINICAL DATA:  Cough and rash  EXAM: PORTABLE CHEST - 1 VIEW  COMPARISON:  12/31/2014  FINDINGS: Cardiac enlargement without heart failure. Mild left lower lobe airspace disease may represent atelectasis or pneumonia. Ill-defined density over the overlying the right chest likely is outside of the patient. Negative for mass or effusion  IMPRESSION: Mild left lower lobe atelectasis/ infiltrate.   Electronically Signed   By: Marlan Palauharles  Clark M.D.   On: 01/04/2015 12:26    EKG: Orders placed or performed during the hospital encounter of 01/04/15  . ED EKG  . ED EKG    IMPRESSION AND PLAN: Patient is a 81 year old white female with the dementia who was just discharged yesterday after a hip fracture repair. Who presents with some cough noted to have atrial fibrillation with RVR in the  ER.  #1. Atrial fibrillation with RVR at this time will try to control her heart rate with IV Cardizem as needed due to her low blood pressure being low I will also give her digoxin dose 1. She is not a candidate for any other type of intervention or full dose anticoagulation. We will update an echocardiogram of the heart to assess her heart function. Next  #2. Cough with new infiltrate on chest x-ray output her on IV Levaquin. We'll obtain a swallow eval.  #3. Recent hip fracture continue physical therapy as tolerated.  #4. Dementia we'll continue her Ativan as she was taking at home.  #5. CODE STATUS is DO NOT RESUSCITATE, however patient has shown progressive decline I will last palliative care team to assist with  the patient's care she may benefit from discharge to rehabilitation with hospice services.    All the records are reviewed and case discussed with ED provider. Management plans discussed with the patient, family and they are in agreement.  CODE STATUS:    Code Status Orders        Start     Ordered   01/04/15 1431  Do not attempt resuscitation (DNR)   Continuous    Question Answer Comment  In the event of cardiac or respiratory ARREST Do not call a "code blue"   In the event of cardiac or respiratory ARREST Do not perform Intubation, CPR, defibrillation or ACLS   In the event of cardiac or respiratory ARREST Use medication by any route, position, wound care, and other measures to relive pain and suffering. May use oxygen, suction and manual treatment of airway obstruction as needed for comfort.      01/04/15 1430    Advance Directive Documentation        Most Recent Value   Type of Advance Directive  Out of facility DNR (pink MOST or yellow form), Healthcare Power of Attorney, Living will   Pre-existing out of facility DNR order (yellow form or pink MOST form)  Yellow form placed in chart (order not valid for inpatient use)   "MOST" Form in Place?         TOTAL  TIME TAKING CARE OF THIS PATIENT: 45 minutes spent minutes. Discussed with patient's daughter at bedside   Auburn Bilberry M.D on 01/04/2015 at 2:42 PM  Between 7am to 6pm - Pager - [phone]  After 6pm go to [email or web address] - password [email or web address] [facility]  [place]  Office  [phone]  CC: Primary care physician; No primary care provider on file.

## 2015-01-04 NOTE — ED Notes (Signed)
DNR form in chart

## 2015-01-04 NOTE — ED Notes (Signed)
Daughter at bedside, patient resting with eyes closed. No needs at this time.

## 2015-01-04 NOTE — ED Notes (Signed)
Foley catheter emptied.

## 2015-01-04 NOTE — ED Notes (Signed)
Dr. Carollee MassedKaminski notified of elevated troponin 0.15

## 2015-01-04 NOTE — ED Notes (Signed)
[facility] with a cough and rash, pt was released last night from repair of Right femoral fracture, upon assessment small red marks all over chest and back, pt arrives with foley, pt has hx of demetia and continues to state "help me"

## 2015-01-05 ENCOUNTER — Observation Stay
Admit: 2015-01-05 | Discharge: 2015-01-05 | Disposition: A | Discharge status: Home / Self Care | Payer: Medicare Other | Attending: Internal Medicine | Admitting: Internal Medicine

## 2015-01-05 DIAGNOSIS — E119 Type 2 diabetes mellitus without complications: Secondary | ICD-10-CM

## 2015-01-05 DIAGNOSIS — Z515 Encounter for palliative care: Secondary | ICD-10-CM | POA: Diagnosis not present

## 2015-01-05 DIAGNOSIS — F0390 Unspecified dementia without behavioral disturbance: Secondary | ICD-10-CM | POA: Diagnosis not present

## 2015-01-05 DIAGNOSIS — E039 Hypothyroidism, unspecified: Secondary | ICD-10-CM | POA: Diagnosis not present

## 2015-01-05 DIAGNOSIS — Z79899 Other long term (current) drug therapy: Secondary | ICD-10-CM | POA: Diagnosis not present

## 2015-01-05 DIAGNOSIS — L299 Pruritus, unspecified: Secondary | ICD-10-CM

## 2015-01-05 DIAGNOSIS — S72001A Fracture of unspecified part of neck of right femur, initial encounter for closed fracture: Secondary | ICD-10-CM | POA: Diagnosis not present

## 2015-01-05 DIAGNOSIS — K5790 Diverticulosis of intestine, part unspecified, without perforation or abscess without bleeding: Secondary | ICD-10-CM

## 2015-01-05 DIAGNOSIS — E43 Unspecified severe protein-calorie malnutrition: Secondary | ICD-10-CM | POA: Diagnosis not present

## 2015-01-05 DIAGNOSIS — F419 Anxiety disorder, unspecified: Secondary | ICD-10-CM | POA: Diagnosis not present

## 2015-01-05 DIAGNOSIS — I4891 Unspecified atrial fibrillation: Principal | ICD-10-CM

## 2015-01-05 LAB — CREATININE, SERUM: CREATININE: 0.59 mg/dL (ref 0.44–1.00)

## 2015-01-05 LAB — CBC
HCT: 29.2 % — ABNORMAL LOW (ref 35.0–47.0)
HEMOGLOBIN: 9.6 g/dL — AB (ref 12.0–16.0)
MCH: 30.6 pg (ref 26.0–34.0)
MCHC: 32.8 g/dL (ref 32.0–36.0)
MCV: 93.3 fL (ref 80.0–100.0)
PLATELETS: 201 10*3/uL (ref 150–440)
RBC: 3.13 MIL/uL — AB (ref 3.80–5.20)
RDW: 14.1 % (ref 11.5–14.5)
WBC: 9.7 10*3/uL (ref 3.6–11.0)

## 2015-01-05 LAB — BASIC METABOLIC PANEL
Anion gap: 5 (ref 5–15)
BUN: 15 mg/dL (ref 6–20)
CALCIUM: 7.8 mg/dL — AB (ref 8.9–10.3)
CHLORIDE: 104 mmol/L (ref 101–111)
CO2: 27 mmol/L (ref 22–32)
Creatinine, Ser: 0.61 mg/dL (ref 0.44–1.00)
GFR calc non Af Amer: >60 mL/min (ref >60)
GLUCOSE: 85 mg/dL (ref 65–99)
Potassium: 4.3 mmol/L (ref 3.5–5.1)
Sodium: 136 mmol/L (ref 135–145)

## 2015-01-05 LAB — TROPONIN I: Troponin I: 0.16 ng/mL — ABNORMAL HIGH (ref <0.031)

## 2015-01-05 MED ORDER — SODIUM CHLORIDE 0.9 % IJ SOLN: 3.0000 mL | INTRAMUSCULAR | Status: DC | PRN

## 2015-01-05 MED ORDER — METOPROLOL TARTRATE 25 MG PO TABS
25.0000 mg | ORAL_TABLET | Freq: Two times a day (BID) | ORAL | Status: DC
  Administered 2015-01-05 – 2015-01-07 (×4): 25 mg via ORAL
  Filled 2015-01-05 (×4): qty 1

## 2015-01-05 MED ORDER — LORAZEPAM 0.5 MG PO TABS
0.2500 mg | ORAL_TABLET | Freq: Four times a day (QID) | ORAL | Status: DC | PRN
  Administered 2015-01-05: 0.25 mg via ORAL
  Filled 2015-01-05: qty 1

## 2015-01-05 MED ORDER — HALOPERIDOL LACTATE 5 MG/ML IJ SOLN
2.0000 mg | Freq: Four times a day (QID) | INTRAMUSCULAR | Status: DC | PRN
  Administered 2015-01-05 – 2015-01-07 (×2): 2 mg via INTRAVENOUS
  Filled 2015-01-05 (×2): qty 1

## 2015-01-05 MED ORDER — HALOPERIDOL LACTATE 5 MG/ML IJ SOLN
2.0000 mg | Freq: Once | INTRAMUSCULAR | Status: AC
  Administered 2015-01-05: 2 mg via INTRAVENOUS
  Filled 2015-01-05: qty 1

## 2015-01-05 MED ORDER — OLANZAPINE 5 MG PO TBDP
2.5000 mg | ORAL_TABLET | Freq: Every day | ORAL | Status: DC
  Administered 2015-01-05: 2.5 mg via ORAL
  Filled 2015-01-05: qty 1

## 2015-01-05 MED ORDER — OLANZAPINE 5 MG PO TBDP
2.5000 mg | ORAL_TABLET | ORAL | Status: AC
  Administered 2015-01-05: 2.5 mg via ORAL
  Filled 2015-01-05: qty 1

## 2015-01-05 MED ORDER — OLANZAPINE 5 MG PO TBDP
5.0000 mg | ORAL_TABLET | Freq: Every day | ORAL | Status: DC
  Administered 2015-01-06 – 2015-01-07 (×2): 5 mg via ORAL
  Filled 2015-01-05 (×2): qty 1

## 2015-01-05 NOTE — Consult Note (Signed)
Palliative Medicine Inpatient Consult Note   Name: Madison Wallace Date: 01/05/2015 MRN: [id number]  DOB: [birth year]  Referring Physician: Shaune Pollack, MD  Palliative Care consult requested for this 81 y.o. female for goals of medical therapy in patient with Dementia, R hip fx, anxiety, hypothyroidism, diverticulosis, and diet controlled diabetes mellitus.  Pt presented to Er from [facility] on 01-03-2015 with intense itching to lower back.  ER workup significant for a-fib with RVR.  Pt recently discharged with [facility] services at [facility].  Daughter at bedside.  Pt resting in bed comfortably. Pt is receiving bed bath from aid currently.      REVIEW OF SYSTEMS:  Patient is not able to provide ROS    SOCIAL HISTORY:  reports that she has never smoked. She has never used smokeless tobacco. She reports that she does not drink alcohol. [facility] Pt is widowed and has one daughter [name], retail    CODE STATUS: DNR  PAST MEDICAL HISTORY: Past Medical History  Diagnosis Date  . Dementia   . Generalized muscle weakness   . Diverticulosis   . Anxiety   . Hypothyroidism   . Diabetes mellitus without complication     Diet Controlled  . GERD (gastroesophageal reflux disease)     PAST SURGICAL HISTORY:  Past Surgical History  Procedure Laterality Date  . Compression hip screw Right 12/31/2014    Procedure: COMPRESSION HIP;  Surgeon: Christena Flake, MD;  Location: [facility] ORS;  Service: Orthopedics;  Laterality: Right;    ALLERGIES:  is allergic to penicillins.  MEDICATIONS:  Current Facility-Administered Medications  Medication Dose Route Frequency Provider Last Rate Last Dose  . 0.9 %  sodium chloride infusion   Intravenous Continuous Auburn Bilberry, MD 75 mL/hr at 01/04/15 2133    . acetaminophen (TYLENOL) tablet 650 mg  650 mg Oral Q6H PRN Auburn Bilberry, MD       Or  . acetaminophen (TYLENOL) suppository 650 mg  650 mg Rectal Q6H PRN Auburn Bilberry, MD      .  alum & mag hydroxide-simeth (MAALOX/MYLANTA) 200-200-20 MG/5ML suspension 30 mL  30 mL Oral Q6H PRN Auburn Bilberry, MD      . beta carotene w/minerals (OCUVITE) tablet 1 tablet  1 tablet Oral Daily Auburn Bilberry, MD   1 tablet at 01/05/15 0917  . busPIRone (BUSPAR) tablet 15 mg  15 mg Oral TID Auburn Bilberry, MD   15 mg at 01/05/15 0914  . calamine lotion   Topical BID PRN Auburn Bilberry, MD      . ciprofloxacin (CIPRO) tablet 500 mg  500 mg Oral BID Auburn Bilberry, MD   500 mg at 01/05/15 0820  . diltiazem (CARDIZEM) injection 10 mg  10 mg Intravenous Q6H PRN Auburn Bilberry, MD      . donepezil (ARICEPT) tablet 10 mg  10 mg Oral QHS Auburn Bilberry, MD   10 mg at 01/04/15 2131  . enoxaparin (LOVENOX) injection 40 mg  40 mg Subcutaneous Q24H Auburn Bilberry, MD   40 mg at 01/04/15 2133  . hydrocortisone cream 1 % 1 application  1 application Topical BID Auburn Bilberry, MD   1 application at 01/05/15 0917  . Levofloxacin (LEVAQUIN) IVPB 250 mg  250 mg Intravenous Q24H Auburn Bilberry, MD   Stopped at 01/04/15 1730  . levothyroxine (SYNTHROID, LEVOTHROID) tablet 25 mcg  25 mcg Oral QAC breakfast Auburn Bilberry, MD   25 mcg at 01/05/15 0820  . LORazepam (ATIVAN) tablet 0.25 mg  0.25 mg Oral QHS Auburn BilberryShreyang Patel, MD   0.25 mg at 01/04/15 2132  . LORazepam (ATIVAN) tablet 0.25 mg  0.25 mg Oral BID PRN Auburn BilberryShreyang Patel, MD   0.25 mg at 01/05/15 0915  . ondansetron (ZOFRAN) tablet 4 mg  4 mg Oral Q6H PRN Auburn BilberryShreyang Patel, MD       Or  . ondansetron (ZOFRAN) injection 4 mg  4 mg Intravenous Q6H PRN Auburn BilberryShreyang Patel, MD      . pantoprazole (PROTONIX) EC tablet 40 mg  40 mg Oral Daily Auburn BilberryShreyang Patel, MD   40 mg at 01/05/15 0915  . polyethylene glycol (MIRALAX / GLYCOLAX) packet 17 g  17 g Oral Daily PRN Auburn BilberryShreyang Patel, MD      . psyllium (HYDROCIL/METAMUCIL) packet 1 packet  1 packet Oral BID Auburn BilberryShreyang Patel, MD   1 packet at 01/05/15 [phone]  . sertraline (ZOLOFT) tablet 100 mg  100 mg Oral Daily Auburn BilberryShreyang Patel, MD    100 mg at 01/05/15 0914  . sodium chloride 0.9 % injection 3 mL  3 mL Intravenous Q12H Auburn BilberryShreyang Patel, MD   3 mL at 01/04/15 2154  . traMADol (ULTRAM) tablet 50 mg  50 mg Oral Q6H PRN Auburn BilberryShreyang Patel, MD      . triamcinolone cream (KENALOG) 0.1 % 1 application  1 application Topical BID Auburn BilberryShreyang Patel, MD   1 application at 01/05/15 0916  . vitamin B-12 (CYANOCOBALAMIN) tablet 1,000 mcg  1,000 mcg Oral Daily Auburn BilberryShreyang Patel, MD   1,000 mcg at 01/05/15 0914    Vital Signs: BP 151/50 mmHg  Pulse 101  Temp(Src) 98.2 F (36.8 C) (Oral)  Resp 20  Ht 5\' 5"  (1.651 m)  Wt 56.337 kg (124 lb 3.2 oz)  BMI 20.67 kg/m2  SpO2 94% Filed Weights   01/04/15 1055 01/05/15 0546  Weight: 54.432 kg (120 lb) 56.337 kg (124 lb 3.2 oz)    Estimated body mass index is 20.67 kg/(m^2) as calculated from the following:   Height as of this encounter: 5\' 5"  (1.651 m).   Weight as of this encounter: 56.337 kg (124 lb 3.2 oz).  PERFORMANCE STATUS (ECOG) : 3 - Symptomatic, >50% confined to bed  PHYSICAL EXAM: General appearance: alert and cooperative Head: Normocephalic, without obvious abnormality, atraumatic Neck: supple, symmetrical, trachea midline and thyroid not enlarged, symmetric, no tenderness/mass/nodules Resp: clear to auscultation bilaterally Cardio: regular rate and rhythm, S1, S2 normal, no murmur, click, rub or gallop GI: soft, non-tender; bowel sounds normal; no masses, no organomegaly Extremities: extremities normal, atraumatic, diffuse petechial spots under R breast Neurologic: Grossly normal   LABS: CBC:    Component Value Date/Time   WBC 9.7 01/05/2015 0533   WBC 7.6 06/04/2014 0618   HGB 9.6* 01/05/2015 0533   HGB 12.4 06/04/2014 0618   HCT 29.2* 01/05/2015 0533   HCT 37.9 06/04/2014 0618   PLT 201 01/05/2015 0533   PLT 165 06/04/2014 0618   MCV 93.3 01/05/2015 0533   MCV 93 06/04/2014 0618   NEUTROABS 3.9 12/31/2014 0220   NEUTROABS 6.0 06/04/2014 0618   LYMPHSABS 1.5  12/31/2014 0220   LYMPHSABS 0.6* 06/04/2014 0618   MONOABS 0.6 12/31/2014 0220   MONOABS 0.7 06/04/2014 0618   EOSABS 0.3 12/31/2014 0220   EOSABS 0.2 06/04/2014 0618   BASOSABS 0.1 12/31/2014 0220   BASOSABS 0.0 06/04/2014 0618   Comprehensive Metabolic Panel:    Component Value Date/Time   NA 136 01/05/2015 0533   NA 136 06/01/2014 2348   K  4.3 01/05/2015 0533   K 3.9 06/01/2014 2348   CL 104 01/05/2015 0533   CL 100 06/01/2014 2348   CO2 27 01/05/2015 0533   CO2 28 06/01/2014 2348   BUN 15 01/05/2015 0533   BUN 14 06/01/2014 2348   CREATININE 0.61 01/05/2015 0533   CREATININE 0.82 06/01/2014 2348   GLUCOSE 85 01/05/2015 0533   GLUCOSE 94 06/01/2014 2348   CALCIUM 7.8* 01/05/2015 0533   CALCIUM 8.3* 06/01/2014 2348   AST 22 01/04/2015 1155   AST 26 06/01/2014 2348   ALT 13* 01/04/2015 1155   ALT 22 06/01/2014 2348   ALKPHOS 36* 01/04/2015 1155   ALKPHOS 42* 06/01/2014 2348   BILITOT 0.8 01/04/2015 1155   PROT 5.6* 01/04/2015 1155   PROT 6.7 06/01/2014 2348   ALBUMIN 2.6* 01/04/2015 1155   ALBUMIN 3.2* 06/01/2014 2348    IMPRESSION:  Palliative Care consult requested for this 81 y.o. female for goals of medical therapy in patient with Dementia, R hip fx, anxiety, hypothyroidism, diverticulosis, and diet controlled diabetes mellitus.  Pt presented to [facility] from [facility] on 01-03-2015 with intense itching to lower back.  ER workup significant for a-fib with RVR.  [facility].  Daughter at bedside.  PT resting in bed comfortably.  Daughter at bedside and agrees to goals of care conversation.  Daughter states since discharge to [facility] pt has been bedbound with low intake.  Daughter states she is seeing a decline in pt.  Spoke with daughter about continued aggressive treatment vs no rehospitalization.  Daughter would for pt not be rehospitalized.  MOST form explained and filled out with daughter.  With decline since discharge  pt will now qualify for hospice services.  CM notified.  Primary: Dementia Secondary: protein calorie malnutrition, R hip fx  FAST 7C PPS 30%  PLAN: 1. MOST form complete 2. Hospice screen     More than 50% of the visit was spent in counseling/coordination of care: Yes  Time Spent: 75 minutes

## 2015-01-05 NOTE — Consult Note (Signed)
Pt with continued agitation.  Spoke with family and will add 2.5 olanzapine x1now.  Will keep 5mg  nightly dose.  Encouraged staff to use haldol for agitation as ativan has not been working.  Ordered foley cather as pt's incontinence is causing agitation.  Time spent: 20 minutes

## 2015-01-05 NOTE — Consult Note (Signed)
Palliative medicine requested due to agitation.  Daughter agrees with olanzapine 2.5 mg PO ODT form.  Pt has been on long acting anti-psychotic before with success.  Pt has adequate prn medications for symptom management.  Attending updated.  Time spent: 15 minutes

## 2015-01-05 NOTE — Progress Notes (Signed)
PT Cancellation Note  Patient Details Name: Madison Wallace MRN: [id number] DOB: 1917/06/23   Cancelled Treatment:    Reason Eval/Treat Not Completed: Medical issues which prohibited therapy;Other (comment) (Family has decided on hospice level of care and declines tx).  Order completed and will come back if pt is more capable and interested.   Ivar DrapeStout, Lanier Millon E 01/05/2015, 12:09 PM   Samul Dadauth Jonavan Vanhorn, PT MS Acute Rehab Dept. Number: [facility] [id number] and [facility] [phone]

## 2015-01-05 NOTE — Progress Notes (Signed)
[facility] [place] [facility]   PATIENT NAME: Madison HoffMildred Wallace    MR#:  [id number]  DATE OF BIRTH:  1916-12-31  SUBJECTIVE:  CHIEF COMPLAINT:   Chief Complaint  Patient presents with  . Rash  . Cough  The pt is demented.  REVIEW OF SYSTEMS:  Demented, unable to get ROS.  DRUG ALLERGIES:   Allergies  Allergen Reactions  . Penicillins Other (See Comments)    Unknown reaction    VITALS:  Blood pressure 151/50, pulse 101, temperature 98.2 F (36.8 C), temperature source Oral, resp. rate 20, height 5\' 5"  (1.651 m), weight 56.337 kg (124 lb 3.2 oz), SpO2 94 %.  PHYSICAL EXAMINATION:  GENERAL:  81 y.o.-year-old patient lying in the bed with no acute distress.  EYES:  No scleral icterus. Extraocular muscles intact.  HEENT: Head atraumatic, normocephalic. Oropharynx and nasopharynx clear.  NECK:  Supple, no jugular venous distention. No thyroid enlargement, no tenderness.  LUNGS: Normal breath sounds bilaterally, no wheezing, rales,rhonchi or crepitation. No use of accessory muscles of respiration.  CARDIOVASCULAR: S1, S2 normal. No murmurs, rubs, or gallops.  ABDOMEN: Soft, nontender, nondistended. Bowel sounds present. No organomegaly or mass.  EXTREMITIES: No pedal edema, cyanosis, or clubbing.  NEUROLOGIC: unable to exam. PSYCHIATRIC: demented. SKIN: No obvious rash, lesion, or ulcer.    LABORATORY PANEL:   CBC  Recent Labs Lab 01/05/15 0533  WBC 9.7  HGB 9.6*  HCT 29.2*  PLT 201   ------------------------------------------------------------------------------------------------------------------  Chemistries   Recent Labs Lab 01/04/15 1155  01/05/15 0533  NA 134*  --  136  K 4.1  --  4.3  CL 102  --  104  CO2 27  --  27  GLUCOSE 103*  --  85  BUN 17  --  15  CREATININE 0.63  < > 0.61  CALCIUM 8.0*  --  7.8*  AST 22  --   --   ALT 13*  --   --   ALKPHOS 36*  --   --   BILITOT 0.8  --   --   < > = values in this  interval not displayed. ------------------------------------------------------------------------------------------------------------------  Cardiac Enzymes  Recent Labs Lab 01/04/15 2238  TROPONINI 0.16*   ------------------------------------------------------------------------------------------------------------------  RADIOLOGY:  Dg Chest Portable 1 View  01/04/2015   CLINICAL DATA:  Cough and rash  EXAM: PORTABLE CHEST - 1 VIEW  COMPARISON:  12/31/2014  FINDINGS: Cardiac enlargement without heart failure. Mild left lower lobe airspace disease may represent atelectasis or pneumonia. Ill-defined density over the overlying the right chest likely is outside of the patient. Negative for mass or effusion  IMPRESSION: Mild left lower lobe atelectasis/ infiltrate.   Electronically Signed   By: Marlan Palauharles  Clark M.D.   On: 01/04/2015 12:26    EKG:   Orders placed or performed during the hospital encounter of 01/04/15  . ED EKG  . ED EKG  . EKG 12-Lead  . EKG 12-Lead    ASSESSMENT AND PLAN:   #1. Atrial fibrillation with RVR: better controlled. Start lopressor bid.  #2. Cough with new infiltrate on chest x-ray. On cipro po.  #3. Recent hip fracture continue physical therapy as tolerated.  #4. Dementia we'll continue her Ativan as she was taking at home. * agitation" add olanzapine.  D/w pt's daughter,  Viviann SpareSteven, Palliative care staff. Hospice screen.   All the records are reviewed and case discussed with Care Management/Social Workerr. Management plans discussed with the patient, family and they  are in agreement.  CODE STATUS:DNR  TOTAL TIME TAKING CARE OF THIS PATIENT: 47 minutes.   POSSIBLE D/C IN 1 DAYS, DEPENDING ON CLINICAL CONDITION.   Shaune Pollackhen, Madison Wallace M.D on 01/05/2015 at 3:36 PM  Between 7am to 6pm - Pager - [phone]  After 6pm go to [email or web address] - password [email or web address] [facility]  [place] [facility]  Office  [phone]  CC: Primary care physician; No primary care  provider on file.

## 2015-01-05 NOTE — Care Management (Signed)
Patient's daughter wishes for patient to return to [facility] with hospice services through [facility]. Notified Renea EeKara Marshall.  [facility].  Updated CSW.  Would anticipate patient could discharge today.  Checking with attending

## 2015-01-05 NOTE — Progress Notes (Signed)
Spoke with Dr. Betti Cruzeddy per foley order. Pt was admitted with foley because of previous urinary retention per family. MD order to removed foley.

## 2015-01-05 NOTE — Progress Notes (Signed)
Patient resting in bed at this time, still confused with slight restlessness noted , foley cath inserted as per order, family at bedside , no distress noted at this time, gen, rash ,meds apply as order. Isolation maintain, endorsed

## 2015-01-05 NOTE — Care Management (Signed)
Patient discharged from  [facility] s/p hip fracture surgical repair.  Chose to return to [facility] and was receiving home health through [facility].  It is reported this was the agency preference due to the ability to transition to a hospice plan of care if it is necessary.   Palliative care has spoken with daughters and it is being considered that patient return to [facility] with hosp[facility] service [facility].  She was sent to the ED from [facility] due to "itching all over" and was found to be in atrial fib.  UPdated CSW that patient is from a facility

## 2015-01-06 DIAGNOSIS — E43 Unspecified severe protein-calorie malnutrition: Secondary | ICD-10-CM | POA: Diagnosis not present

## 2015-01-06 DIAGNOSIS — F0390 Unspecified dementia without behavioral disturbance: Secondary | ICD-10-CM | POA: Diagnosis present

## 2015-01-06 DIAGNOSIS — Z7401 Bed confinement status: Secondary | ICD-10-CM | POA: Diagnosis not present

## 2015-01-06 DIAGNOSIS — L299 Pruritus, unspecified: Secondary | ICD-10-CM | POA: Diagnosis present

## 2015-01-06 DIAGNOSIS — K5790 Diverticulosis of intestine, part unspecified, without perforation or abscess without bleeding: Secondary | ICD-10-CM | POA: Diagnosis present

## 2015-01-06 DIAGNOSIS — R32 Unspecified urinary incontinence: Secondary | ICD-10-CM | POA: Diagnosis present

## 2015-01-06 DIAGNOSIS — S72001A Fracture of unspecified part of neck of right femur, initial encounter for closed fracture: Secondary | ICD-10-CM | POA: Diagnosis not present

## 2015-01-06 DIAGNOSIS — L89152 Pressure ulcer of sacral region, stage 2: Secondary | ICD-10-CM | POA: Diagnosis present

## 2015-01-06 DIAGNOSIS — Z79899 Other long term (current) drug therapy: Secondary | ICD-10-CM | POA: Diagnosis not present

## 2015-01-06 DIAGNOSIS — K219 Gastro-esophageal reflux disease without esophagitis: Secondary | ICD-10-CM | POA: Diagnosis present

## 2015-01-06 DIAGNOSIS — Z66 Do not resuscitate: Secondary | ICD-10-CM | POA: Diagnosis present

## 2015-01-06 DIAGNOSIS — E46 Unspecified protein-calorie malnutrition: Secondary | ICD-10-CM | POA: Diagnosis present

## 2015-01-06 DIAGNOSIS — I4891 Unspecified atrial fibrillation: Secondary | ICD-10-CM | POA: Diagnosis present

## 2015-01-06 DIAGNOSIS — Z515 Encounter for palliative care: Secondary | ICD-10-CM | POA: Diagnosis not present

## 2015-01-06 DIAGNOSIS — H9190 Unspecified hearing loss, unspecified ear: Secondary | ICD-10-CM | POA: Diagnosis present

## 2015-01-06 DIAGNOSIS — J189 Pneumonia, unspecified organism: Secondary | ICD-10-CM | POA: Diagnosis present

## 2015-01-06 DIAGNOSIS — E119 Type 2 diabetes mellitus without complications: Secondary | ICD-10-CM | POA: Diagnosis present

## 2015-01-06 DIAGNOSIS — Z880 Allergy status to penicillin: Secondary | ICD-10-CM | POA: Diagnosis not present

## 2015-01-06 DIAGNOSIS — E039 Hypothyroidism, unspecified: Secondary | ICD-10-CM | POA: Diagnosis present

## 2015-01-06 DIAGNOSIS — R21 Rash and other nonspecific skin eruption: Secondary | ICD-10-CM | POA: Diagnosis present

## 2015-01-06 DIAGNOSIS — F419 Anxiety disorder, unspecified: Secondary | ICD-10-CM | POA: Diagnosis present

## 2015-01-06 DIAGNOSIS — I10 Essential (primary) hypertension: Secondary | ICD-10-CM | POA: Diagnosis present

## 2015-01-06 NOTE — Progress Notes (Signed)
Patient confused and agitated earlier in the shift. Prior to Haldol administration, patient pulled out foley catheter... With balloon inflated. New catheter inserted and remains in place, draining urine well. Patient remains confused, however less agitated. Will continue to monitor.

## 2015-01-06 NOTE — Progress Notes (Signed)
CSW spoke with Kendal HymenBonnie at [facility] who reports they plan to have pt return provided hospice is following. RNCM aware of hospice need. Kendal HymenBonnie reports she will eval [facility] this weekend once pt is ready for d/c. Per MD note, possible d/c Sunday. CSW will follow. Dellie BurnsJosie Cathan Gearin, MSW, LCSW [phone] (weekend coverage)

## 2015-01-06 NOTE — Progress Notes (Signed)
Patient resting in bed at this time but arouesable, family at bedside , contact isolation maintain, po intake very poor, no distress noted , denies pain , slight itching noted meds as order, care ongoing endorsed,

## 2015-01-06 NOTE — Progress Notes (Signed)
[facility] - [place] [facility]   PATIENT NAME: Madison HoffMildred Wallace    MR#:  [id number]  DATE OF BIRTH:  [birth year]  SUBJECTIVE:  CHIEF COMPLAINT:   Chief Complaint  Patient presents with  . Rash  . Cough  The pt is demented.  REVIEW OF SYSTEMS:  Demented, unable to get ROS.  DRUG ALLERGIES:   Allergies  Allergen Reactions  . Penicillins Other (See Comments)    Unknown reaction    VITALS:  Blood pressure 151/44, pulse 105, temperature 98.5 F (36.9 C), temperature source Oral, resp. rate 20, height 5\' 5"  (1.651 m), weight 56.337 kg (124 lb 3.2 oz), SpO2 92 %.  PHYSICAL EXAMINATION:  GENERAL:  81 y.o.-year-old patient lying in the bed with no acute distress.  EYES:  No scleral icterus. Extraocular muscles intact.  HEENT: Head atraumatic, normocephalic. Oropharynx and nasopharynx clear.  NECK:  Supple, no jugular venous distention. No thyroid enlargement, no tenderness.  LUNGS: Normal breath sounds bilaterally, no wheezing, rales,rhonchi or crepitation. No use of accessory muscles of respiration.  CARDIOVASCULAR: S1, S2 normal. No murmurs, rubs, or gallops.  ABDOMEN: Soft, nontender, nondistended. Bowel sounds present. No organomegaly or mass.  EXTREMITIES: No pedal edema, cyanosis, or clubbing.  NEUROLOGIC: unable to exam. PSYCHIATRIC: demented. SKIN: No obvious rash, lesion, or ulcer.    LABORATORY PANEL:   CBC  Recent Labs Lab 01/05/15 0533  WBC 9.7  HGB 9.6*  HCT 29.2*  PLT 201   ------------------------------------------------------------------------------------------------------------------  Chemistries   Recent Labs Lab 01/04/15 1155  01/05/15 0533  NA 134*  --  136  K 4.1  --  4.3  CL 102  --  104  CO2 27  --  27  GLUCOSE 103*  --  85  BUN 17  --  15  CREATININE 0.63  < > 0.61  CALCIUM 8.0*  --  7.8*  AST 22  --   --   ALT 13*  --   --   ALKPHOS 36*  --   --   BILITOT 0.8  --   --   < > = values in this  interval not displayed. ------------------------------------------------------------------------------------------------------------------  Cardiac Enzymes  Recent Labs Lab 01/04/15 2238  TROPONINI 0.16*   ------------------------------------------------------------------------------------------------------------------  RADIOLOGY:  Dg Chest Portable 1 View  01/04/2015   CLINICAL DATA:  Cough and rash  EXAM: PORTABLE CHEST - 1 VIEW  COMPARISON:  12/31/2014  FINDINGS: Cardiac enlargement without heart failure. Mild left lower lobe airspace disease may represent atelectasis or pneumonia. Ill-defined density over the overlying the right chest likely is outside of the patient. Negative for mass or effusion  IMPRESSION: Mild left lower lobe atelectasis/ infiltrate.   Electronically Signed   By: Marlan Palauharles  Clark M.D.   On: 01/04/2015 12:26    EKG:   Orders placed or performed during the hospital encounter of 01/04/15  . ED EKG  . ED EKG  . EKG 12-Lead  . EKG 12-Lead    ASSESSMENT AND PLAN:   #1. Atrial fibrillation with RVR: better controlled.rate controlled,continue lopressor 25 mg BID #2. Left lower lobe pneumonia;continue  cipro  #3. Recent hip fracture continue physical therapy as tolerated.  #4. Dementia we'll continue her Ativan as she was taking at home. *agitation;continue Buspar,olanzapine 5.pruritus;dry skin ,use calamine and hydrocortisone 6/Dementia DNR   D/w pt's daughter,  Viviann SpareSteven, Palliative care staff. Hospice screen.   All the records are reviewed and case discussed with Care Management/Social Workerr. Management plans discussed with the  patient, family and they are in agreement.  CODE STATUS:DNR  TOTAL TIME TAKING CARE OF THIS PATIENT: 47 minutes.   POSSIBLE D/C IN 1 DAYS, DEPENDING ON CLINICAL CONDITION.   Katha HammingKONIDENA,Hiawatha Dressel M.D on 01/06/2015 at 9:29 AM  Between 7am to 6pm - Pager - [phone]  After 6pm go to [email or web address] - password [email or web address]  [facility]  [place]  Office  [phone]  CC: Primary care physician; No primary care provider on file.

## 2015-01-07 MED ORDER — METOPROLOL TARTRATE 50 MG PO TABS
50.0000 mg | ORAL_TABLET | Freq: Two times a day (BID) | ORAL | Status: DC
  Administered 2015-01-07 – 2015-01-08 (×2): 50 mg via ORAL
  Filled 2015-01-07 (×2): qty 1

## 2015-01-07 NOTE — Progress Notes (Signed)
Pt confused and lethargic. Can be aroused by voice and gentle touch. VSS. No s/s of pain or distress. A.fibb on tele. Foley in place and maintained Contact precautions maintained. Pt resting in bed. Will continue to monitor.

## 2015-01-07 NOTE — Progress Notes (Signed)
Per Dr. Luberta MutterKonidena, patient does not have scabies and does not need to be on contact precautions. Order removed.

## 2015-01-07 NOTE — Progress Notes (Signed)
[facility] -  at [facility]   PATIENT NAME: Madison HoffMildred Wallace    MR#:  [id number]  DATE OF BIRTH:  06/06/1917  SUBJECTIVE:  CHIEF COMPLAINT:   Chief Complaint  Patient presents with  . Rash  . Cough  The pt is demented.  REVIEW OF SYSTEMS:  Demented, unable to get ROS.resting comfortably.d/w daughter ,they feel comfortable  With discharge tomorrow,  DRUG ALLERGIES:   Allergies  Allergen Reactions  . Penicillins Other (See Comments)    Unknown reaction    VITALS:  Blood pressure 152/101, pulse 59, temperature 98 F (36.7 C), temperature source Oral, resp. rate 18, height 5\' 5"  (1.651 m), weight 56.4 kg (124 lb 5.4 oz), SpO2 97 %.  PHYSICAL EXAMINATION:  GENERAL:  81 y.o.-year-old patient lying in the bed with no acute distress.  EYES:  No scleral icterus. Extraocular muscles intact.  HEENT: Head atraumatic, normocephalic. Oropharynx and nasopharynx clear.  NECK:  Supple, no jugular venous distention. No thyroid enlargement, no tenderness.  LUNGS: Normal breath sounds bilaterally, no wheezing, rales,rhonchi or crepitation. No use of accessory muscles of respiration.  CARDIOVASCULAR: S1, S2 normal. No murmurs, rubs, or gallops.  ABDOMEN: Soft, nontender, nondistended. Bowel sounds present. No organomegaly or mass.  EXTREMITIES: No pedal edema, cyanosis, or clubbing.  NEUROLOGIC: unable to exam. PSYCHIATRIC: demented. SKIN: No obvious rash, lesion, or ulcer.    LABORATORY PANEL:   CBC  Recent Labs Lab 01/05/15 0533  WBC 9.7  HGB 9.6*  HCT 29.2*  PLT 201   ------------------------------------------------------------------------------------------------------------------  Chemistries   Recent Labs Lab 01/04/15 1155  01/05/15 0533  NA 134*  --  136  K 4.1  --  4.3  CL 102  --  104  CO2 27  --  27  GLUCOSE 103*  --  85  BUN 17  --  15  CREATININE 0.63  < > 0.61  CALCIUM 8.0*  --  7.8*  AST 22  --   --   ALT 13*  --   --    ALKPHOS 36*  --   --   BILITOT 0.8  --   --   < > = values in this interval not displayed. ------------------------------------------------------------------------------------------------------------------  Cardiac Enzymes  Recent Labs Lab 01/04/15 2238  TROPONINI 0.16*   ------------------------------------------------------------------------------------------------------------------  RADIOLOGY:  No results found.  EKG:   Orders placed or performed during the hospital encounter of 01/04/15  . ED EKG  . ED EKG  . EKG 12-Lead  . EKG 12-Lead    ASSESSMENT AND P   #1. Atrial fibrillation with RVR: rate still around 110-114,with elevated  BP ;continuelopressor,but increase to 50 mg bid #2. Left lower lobe pneumonia;continue  Cipro,no hypoxia,no fever  #3. Recent hip fracture continue physical therapy as tolerated.  #4. Dementia we'll continue her Ativan as she was taking at home. *agitation;continue Buspar,olanzapine 5.pruritus;dry skin ,use calamine and hydrocortisone 6/Dementia Plan to remove foley,bladder training today,  D/w pt's daughter,  Madison Wallace, Palliative care staff. D/c isolation,she is isolation for possible sabies,but she does not have scabies.   All the records are reviewed and case discussed with Care Management/Social Workerr. Management plans discussed with the patient, family and they are in agreement.  CODE STATUS:DNR  TOTAL TIME TAKING CARE OF THIS PATIENT: 47 minutes.   POSSIBLE D/C IN 1 DAYS, DEPENDING ON CLINICAL CONDITION.   Katha HammingKONIDENA,Meital Riehl M.D on 01/07/2015 at 11:12 AM  Between 7am to 6pm - Pager - [phone]  After 6pm go to [email or web address] -  password [email or web address] [facility]  [place] [facility]  Office  [phone]  CC: Primary care physician; No primary care provider on file.

## 2015-01-07 NOTE — Progress Notes (Signed)
Alert, disoriented, oriented to self only. Foley catheter removed today at 1500. Patient has had one episode of incontinence with little output. Catheter only had 25mL of output for the shift before removing it. Patient was afib on tele this morning and converted to sinus rhythm this afternoon. Patient no longer on precautions for scabies. Will continue to monitor.

## 2015-01-08 MED ORDER — ALUM & MAG HYDROXIDE-SIMETH 200-200-20 MG/5ML PO SUSP: 30.0000 mL | Freq: Four times a day (QID) | ORAL | Status: AC | PRN

## 2015-01-08 MED ORDER — LORAZEPAM 0.5 MG PO TABS: 0.2500 mg | ORAL_TABLET | Freq: Four times a day (QID) | ORAL | Status: AC | PRN

## 2015-01-08 MED ORDER — CIPROFLOXACIN HCL 500 MG PO TABS: 500.0000 mg | ORAL_TABLET | Freq: Two times a day (BID) | ORAL | Status: AC

## 2015-01-08 MED ORDER — OLANZAPINE 5 MG PO TBDP: 5.0000 mg | ORAL_TABLET | Freq: Every day | ORAL | Status: AC

## 2015-01-08 MED ORDER — METOPROLOL TARTRATE 50 MG PO TABS: 50.0000 mg | ORAL_TABLET | Freq: Two times a day (BID) | ORAL | Status: AC

## 2015-01-08 NOTE — Progress Notes (Addendum)
After visit summary given to daughter along with metoprolol and afib education. Social work prepared packet and is ready to go with portable DNR in packet. EMS has been called for pickup. IV and tele removed. Family has no further questions. Awaiting EMS arrival.  [facility] Interior and spatial designerdirector, at [facility].

## 2015-01-08 NOTE — Consult Note (Signed)
Palliative Medicine Inpatient Consult Follow Up Note   Name: Madison HoffMildred Nie Date: 01/08/2015 MRN: [id number]  DOB: [birth year]  Referring Physician: Katha HammingSnehalatha Konidena, MD  Palliative Care consult requested for this 81 y.o. female for goals of medical therapy in patient with dementia, R hip fx, anxiety, admitted 01-03-2015 with a.fib with RVR  Ms Madison Wallace is lying in bed. Calm, answers questions with short answers. Daughter at bedside feeding her breakfast.  REVIEW OF SYSTEMS:  Pain: None Dyspnea:  No Nausea/Vomiting:  No Diarrhea:  No Constipation:   No Depression:   No Anxiety:   No Fatigue:   No  CODE STATUS: DNR   PAST MEDICAL HISTORY: Past Medical History  Diagnosis Date  . Dementia   . Generalized muscle weakness   . Diverticulosis   . Anxiety   . Hypothyroidism   . Diabetes mellitus without complication     Diet Controlled  . GERD (gastroesophageal reflux disease)     PAST SURGICAL HISTORY:  Past Surgical History  Procedure Laterality Date  . Compression hip screw Right 12/31/2014    Procedure: COMPRESSION HIP;  Surgeon: Christena FlakeJohn J Poggi, MD;  Location: [facility] ORS;  Service: Orthopedics;  Laterality: Right;    Vital Signs: BP 136/46 mmHg  Pulse 65  Temp(Src) 97.3 F (36.3 C) (Oral)  Resp 18  Ht 5\' 5"  (1.651 m)  Wt 54.522 kg (120 lb 3.2 oz)  BMI 20.00 kg/m2  SpO2 95% Filed Weights   01/05/15 0546 01/07/15 0644 01/08/15 0443  Weight: 56.337 kg (124 lb 3.2 oz) 56.4 kg (124 lb 5.4 oz) 54.522 kg (120 lb 3.2 oz)    Estimated body mass index is 20 kg/(m^2) as calculated from the following:   Height as of this encounter: 5\' 5"  (1.651 m).   Weight as of this encounter: 54.522 kg (120 lb 3.2 oz).  PHYSICAL EXAM: Generall: ill-appearing HEENT: OP clear, decreased hearing Neck: Trachea midline  Cardiovascular: irregular rate and rhythm Pulmonary/Chest: Clear ant fields Abdominal: Soft, NTTP, + bowel sounds GU: No SP tenderness Extremities: No edema   Neurological: moves extremities, follows simple commands Skin: rash over abdomen Psychiatric: calm, confused   LABS: CBC:    Component Value Date/Time   WBC 9.7 01/05/2015 0533   WBC 7.6 06/04/2014 0618   HGB 9.6* 01/05/2015 0533   HGB 12.4 06/04/2014 0618   HCT 29.2* 01/05/2015 0533   HCT 37.9 06/04/2014 0618   PLT 201 01/05/2015 0533   PLT 165 06/04/2014 0618   MCV 93.3 01/05/2015 0533   MCV 93 06/04/2014 0618   NEUTROABS 3.9 12/31/2014 0220   NEUTROABS 6.0 06/04/2014 0618   LYMPHSABS 1.5 12/31/2014 0220   LYMPHSABS 0.6* 06/04/2014 0618   MONOABS 0.6 12/31/2014 0220   MONOABS 0.7 06/04/2014 0618   EOSABS 0.3 12/31/2014 0220   EOSABS 0.2 06/04/2014 0618   BASOSABS 0.1 12/31/2014 0220   BASOSABS 0.0 06/04/2014 0618   Comprehensive Metabolic Panel:    Component Value Date/Time   NA 136 01/05/2015 0533   NA 136 06/01/2014 2348   K 4.3 01/05/2015 0533   K 3.9 06/01/2014 2348   CL 104 01/05/2015 0533   CL 100 06/01/2014 2348   CO2 27 01/05/2015 0533   CO2 28 06/01/2014 2348   BUN 15 01/05/2015 0533   BUN 14 06/01/2014 2348   CREATININE 0.61 01/05/2015 0533   CREATININE 0.82 06/01/2014 2348   GLUCOSE 85 01/05/2015 0533   GLUCOSE 94 06/01/2014 2348   CALCIUM 7.8* 01/05/2015 0533   CALCIUM  8.3* 06/01/2014 2348   AST 22 01/04/2015 1155   AST 26 06/01/2014 2348   ALT 13* 01/04/2015 1155   ALT 22 06/01/2014 2348   ALKPHOS 36* 01/04/2015 1155   ALKPHOS 42* 06/01/2014 2348   BILITOT 0.8 01/04/2015 1155   PROT 5.6* 01/04/2015 1155   PROT 6.7 06/01/2014 2348   ALBUMIN 2.6* 01/04/2015 1155   ALBUMIN 3.2* 06/01/2014 2348    IMPRESSION: Ms Madison Wallace is a 81 yo woman with dementia, R hip fx, anxiety, hypothyroidism, diverticulosis, and diet controlled diabetes mellitus.She was admitted 01-03-2015 with a.fib with RVR. Hospital course complicated by delirium.   Pt much calmer today. Getting olanzapine hs. Only required 2mg  haldol and .25mg  lorazepam over past 24hrs.  Plan is for pt to return to ALF with hospice following. Hospice liason to see pt. Daughter now questioning whether ALF can provide adequate care. Consult CSW.   PLAN: 1. Hospice liason to see 2. CSW consult.  REFERRALS TO BE ORDERED:  Hospice Social work   More than 50% of the visit was spent in counseling/coordination of care: YES  Time spent: 25 minutes

## 2015-01-08 NOTE — Progress Notes (Signed)
Follow up on new referral for [facility] services after discharge.  Writer spoke with daughter [name], who had questions about hospice services at [facility], questions answered. Patient has [facility] a [facility] years, she recently had been moved to the Memory Care unit there. [name] was most concerned about patient's "calming" medication and the need for her medications to be available [facility] when she needs them. Assurance given that Hospice would assist with obtaining medications if needed. [name] agreeable to return to [facility], CSW  and MD aware of plan for transfer via EMS back to [facility] with admission to Hospice services today.Thank you. Out of facility DNR and discharge prescriptions in place on patient's chart. Discharge summary and current notes faxed to Referral intake. Thank you. Dayna BarkerKaren Robertson RN, BSN, [facility], [facility] Liaison [phone]

## 2015-01-08 NOTE — Clinical Social Work Note (Signed)
Clinical Social Work Assessment  Patient Details  Name: Madison Wallace MRN: [id number] Date of Birth: February 14, 1917  Date of referral:  01/08/15               Reason for consult:   (from [facility]F)                Permission sought to share information with:  Family Supports, Oceanographeracility Contact Representative Permission granted to share information::  Yes, Verbal Permission Granted  Name::        Agency::     Relationship::     Contact Information:     Housing/Transportation Living arrangements for the past 2 months:  Assisted Living Facility Source of Information:  Patient, Adult Children, Facility Patient Interpreter Needed:  None Criminal Activity/Legal Involvement Pertinent to Current Situation/Hospitalization:  No - Comment as needed Significant Relationships:  Adult Children Lives with:  Other (Comment) (from facility) Do you feel safe going back to the place where you live?  Yes Need for family participation in patient care:  Yes (Comment)  Care giving concerns:  Pt's daughter [name] [phone] had some questions about medication, these questions were answered by the Hospice Liason.  Pt will return to ALF with hospice following.   Social Worker assessment / plan:  Current DC plan is to DC back to her ALF, with hospice following.  Employment status:  Retired Health and safety inspectornsurance information:  Armed forces operational officerMedicare, Medicaid [place] PT Recommendations:    Information / Referral to community resources:     Patient/Family's Response to care:  Pt was not oriented enough to respond.  Patient/Family's Understanding of and Emotional Response to Diagnosis, Current Treatment, and Prognosis:  Pt's daughter was in agreement with this plan.  Emotional Assessment Appearance:  Appears stated age Attitude/Demeanor/Rapport:   (pt was very confused and falling alseep during assessment.  CSW spoke to pt's daughter mostly.  ) Affect (typically observed):   (confused.) Orientation:  Oriented to Self Alcohol /  Substance use:  Never Used Psych involvement (Current and /or in the community):  No (Comment)  Discharge Needs  Concerns to be addressed:    Readmission within the last 30 days:  Yes Current discharge risk:    Barriers to Discharge:      Chauncy PassyBennerson, Jadore Veals J, LCSW 01/08/2015, 1:51 PM

## 2015-01-08 NOTE — Discharge Instructions (Signed)
F/U with ortho in one month

## 2015-01-08 NOTE — Discharge Summary (Signed)
Madison HoffMildred Wallace, is a 81 y.o. female  DOB [birth year]  MRN [id number].  Admission date:  01/04/2015  Admitting Physician  Auburn BilberryShreyang Patel, MD  Discharge Date:  01/08/2015   Primary MD  No primary care provider on file.  Recommendations for primary care physician for things to follow:    Admission Diagnosis  Decubitus ulcer, stage II [L89.92] Troponin I above reference range [R79.89] Atrial fibrillation with RVR [I48.91] Atrial fibrillation, new onset [I48.91]   Discharge Diagnosis  Decubitus ulcer, stage II [L89.92] Troponin I above reference range [R79.89] Atrial fibrillation with RVR [I48.91] Atrial fibrillation, new onset [I48.91]   Active Problems:   Afib   PNA (pneumonia)      Past Medical History  Diagnosis Date  . Dementia   . Generalized muscle weakness   . Diverticulosis   . Anxiety   . Hypothyroidism   . Diabetes mellitus without complication     Diet Controlled  . GERD (gastroesophageal reflux disease)     Past Surgical History  Procedure Laterality Date  . Compression hip screw Right 12/31/2014    Procedure: COMPRESSION HIP;  Surgeon: Christena FlakeJohn J Poggi, MD;  Location: [facility] ORS;  Service: Orthopedics;  Laterality: Right;       History of present illness and  Hospital Course:     Kindly see H&P for history of present illness and admission details, please review complete Labs, Consult reports and Test reports for all details in brief brought in from an assisted-living brought in from assisted living  HPI  from the history and physical done on the day of admission    Hospital Course ; 81 year old female patient with dementia discharged recently after hip fracture repair brought in from assisted living, home place because of intense pruritus and A. fib with RVR with heart rate 1 20 bpm. Patient  admitted to telemetry because ofafibRVR. Received IV Cardizem in the emergency room. Patient is not candidate for Anticoagulation because of advanced age. Her itching initially  Thought to be due to scabies,she was placed in isolation for scabies but her rash did not look like scabies rash does not involve finger web spaces and the patient started on calamine and hydrocortisone lotion and her rash is More like  for dry skin rash and the rash disappeared with the combination of calamine and hydrocortisone. Regarding atrial fibrillation heart rate has been better with Lopressor and she is right now on Lopressor 50 mg twice a day for control of heart disease and hypertension. #2: Anxiety, dementia/: She is on Aricept, Zyprexa,  Xanax. And this medication combination is helping her and she will need to continue that. She is on olanzapine  5 mg at night. Independently care from Dr. Clarice Pole, the plan is return to home place with hosp[facility] services through [facility]. 3.Pneumonia patient had some cough, chest x-ray showed left lower lobe pneumonia. StartCipro patient is already received 4 days of Cipro and she will continue another 5 days of Cipro. She is not hypoxic, afebrile. Her CBC is normal white count 9.7 two  days ago.     Discharge Condition: stable   Follow UP;      Discharge Instructions  and  Discharge Medications        Medication List    STOP taking these medications        enoxaparin 40 MG/0.4ML injection  Commonly known as:  LOVENOX     triamcinolone cream 0.1 %  Commonly known as:  KENALOG      TAKE these medications        acetaminophen 500 MG tablet  Commonly known as:  TYLENOL  Take 500 mg by mouth at bedtime.     alum & mag hydroxide-simeth 200-200-20 MG/5ML suspension  Commonly known as:  MAALOX/MYLANTA  Take 30 mLs by mouth every 6 (six) hours as needed for indigestion or heartburn (dyspepsia).     busPIRone 15 MG tablet  Commonly known as:  BUSPAR   Take 15 mg by mouth 3 (three) times daily.     calamine lotion  Apply topically 2 (two) times daily as needed for itching (or dry skin).     ciprofloxacin 500 MG tablet  Commonly known as:  CIPRO  Take 1 tablet (500 mg total) by mouth 2 (two) times daily.     donepezil 10 MG tablet  Commonly known as:  ARICEPT  Take 10 mg by mouth at bedtime.     hydrocortisone 2.5 % cream  Apply 1 application topically 2 (two) times daily. Apply 1g topically twice daily to back until resolved.     levothyroxine 25 MCG tablet  Commonly known as:  SYNTHROID, LEVOTHROID  Take 25 mcg by mouth daily.     LORazepam 0.5 MG tablet  Commonly known as:  ATIVAN  Take 0.5 tablets (0.25 mg total) by mouth at bedtime.     LORazepam 0.5 MG tablet  Commonly known as:  ATIVAN  Take 0.5 tablets (0.25 mg total) by mouth every 6 (six) hours as needed for anxiety.     metoprolol 50 MG tablet  Commonly known as:  LOPRESSOR  Take 1 tablet (50 mg total) by mouth 2 (two) times daily.     nystatin cream  Commonly known as:  MYCOSTATIN  Apply 1 application topically 2 (two) times daily.     OLANZapine zydis 5 MG disintegrating tablet  Commonly known as:  ZYPREXA  Take 1 tablet (5 mg total) by mouth at bedtime.     pantoprazole 40 MG tablet  Commonly known as:  PROTONIX  Take 40 mg by mouth daily.     polyethylene glycol packet  Commonly known as:  MIRALAX / GLYCOLAX  Take 17 g by mouth daily as needed (for constipation). Fill cap to 17gm mark, mix with 4-8 ounces of fluid and take by mouth every day as needed for constipation     Pramoxine-Calamine 1-3 % Lotn  Apply 1 application topically 2 (two) times daily as needed (for dry skin or itching.).     PRESERVISION AREDS 2 Caps  Take 1 capsule by mouth daily.     psyllium 0.52 G capsule  Commonly known as:  REGULOID  Take 2 capsules by mouth 2 (two) times daily.     sertraline 100 MG tablet  Commonly known as:  ZOLOFT  Take 100 mg by mouth daily.      traMADol 50 MG tablet  Commonly known as:  ULTRAM  Take 1 tablet (50 mg total) by mouth every 6 (six) hours as needed.     vitamin B-12 1000 MCG tablet  Commonly known as:  CYANOCOBALAMIN  Take 1,000 mcg by mouth daily.       Cor status is DO NOT RESUSCITATE.   Diet and Activity recommendation: See Discharge Instructions above  2 g sodium diet with thin liquids. Consults obtained - Palliative care consult with Dr. Clarice Pole   Major procedures and Radiology Reports - PLEASE review detailed and final reports for all details, in brief -     Dg Pelvis Portable  12/31/2014   CLINICAL DATA:  RIGHT hip fracture.  ORIF.  EXAM: DG C-ARM 1-60 MIN - [facility]PT [facility]; PORTABLE PELVIS 1-2 VIEWS  COMPARISON:  12/31/2014.  FINDINGS: RIGHT hip gamma nail fixation is present. No complicating features. Distal interlocking screw is present. Four intraoperative fluoroscopic spot films are submitted for interpretation.  IMPRESSION: RIGHT hip gamma nail fixation.   Electronically Signed   By: Andreas Newport M.D.   On: 12/31/2014 13:46   Dg Chest Portable 1 View  01/04/2015   CLINICAL DATA:  Cough and rash  EXAM: PORTABLE CHEST - 1 VIEW  COMPARISON:  12/31/2014  FINDINGS: Cardiac enlargement without heart failure. Mild left lower lobe airspace disease may represent atelectasis or pneumonia. Ill-defined density over the overlying the right chest likely is outside of the patient. Negative for mass or effusion  IMPRESSION: Mild left lower lobe atelectasis/ infiltrate.   Electronically Signed   By: Marlan Palau M.D.   On: 01/04/2015 12:26   Dg Chest Portable 1 View  12/31/2014   CLINICAL DATA:  Preoperative  EXAM: PORTABLE CHEST - 1 VIEW  COMPARISON:  01/06/2013  FINDINGS: There is marked unchanged cardiomegaly and mild aortic tortuosity. The lungs are grossly clear. There is no large effusion. Pulmonary vasculature is normal.  IMPRESSION: Cardiomegaly.  No acute findings.   Electronically Signed   By: Ellery Plunk M.D.   On: 12/31/2014 03:35   Dg C-arm 1-60 Min-no Report  12/31/2014   CLINICAL DATA:  RIGHT hip fracture.  ORIF.  EXAM: DG C-ARM 1-60 MIN - [facility]; PORTABLE PELVIS 1-2 VIEWS  COMPARISON:  12/31/2014.  FINDINGS: RIGHT hip gamma nail fixation is present. No complicating features. Distal interlocking screw is present. Four intraoperative fluoroscopic spot films are submitted for interpretation.  IMPRESSION: RIGHT hip gamma nail fixation.   Electronically Signed   By: Andreas Newport M.D.   On: 12/31/2014 13:46   Dg Hip Port Unilat With Pelvis 1v Right  12/31/2014   CLINICAL DATA:  Larey Seat at home.  Initial encounter.  EXAM: RIGHT HIP (WITH PELVIS) 1 VIEW PORTABLE  COMPARISON:  None.  FINDINGS: There is an intertrochanteric right hip fracture with varus angulation. There is no dislocation. No bone lesion or bony destruction is evident.  IMPRESSION: Intertrochanteric right hip fracture   Electronically Signed   By: Ellery Plunk M.D.   On: 12/31/2014 03:16   Dg Hip Operative Unilat With Pelvis Right  12/31/2014   CLINICAL DATA:  Operative fixation of a right hip  intertrochanteric fracture.  EXAM: OPERATIVE RIGHT HIP (WITH PELVIS IF PERFORMED) 4 VIEWS  TECHNIQUE: Fluoroscopic spot image(s) were submitted for interpretation post-operatively.  FLUOROSCOPY TIME:  Radiation Exposure Index (as provided by the fluoroscopic device): Not applicable  If the device does not provide the exposure index:  Fluoroscopy Time:  0 min 55 seconds  Number of Acquired Images:  4  COMPARISON:  Radiographs obtained earlier today.  FINDINGS: Compression screw and rod fixation of the previously demonstrated right intertrochanteric fracture with anatomic position and alignment. No complicating features.  IMPRESSION: Hardware fixation of the previously seen right intertrochanteric fracture with anatomic position and alignment.   Electronically Signed   By: Beckie SaltsSteven  Reid M.D.   On: 12/31/2014 13:04    Micro Results     Recent Results (from the past 240 hour(s))  Urine culture     Status: None   Collection Time: 01/02/15 12:45 PM  Result Value Ref Range Status   Specimen Description URINE, CATHETERIZED  Final   Special Requests NONE  Final   Culture NO GROWTH 2 DAYS  Final   Report Status 01/04/2015 FINAL  Final       Today   Subjective:   Madison HoffMildred Wallace today has no headache,no chest abdominal pain,no new weakness tingling or numbness, feels much better wants to go home today.   Objective:   Blood pressure 136/46, pulse 65, temperature 97.3 F (36.3 C), temperature source Oral, resp. rate 18, height 5\' 5"  (1.651 m), weight 54.522 kg (120 lb 3.2 oz), SpO2 95 %.   Intake/Output Summary (Last 24 hours) at 01/08/15 0836 Last data filed at 01/08/15 0730  Gross per 24 hour  Intake      0 ml  Output     25 ml  Net    -25 ml    Exam Awake Alert, demented, No new F.N deficits, Normal affect [place].AT,PERRAL Supple Neck,No JVD, No cervical lymphadenopathy appriciated.  Symmetrical Chest wall movement, Good air movement bilaterally, CTAB RRR,No Gallops,Rubs or new Murmurs, No Parasternal Heave +ve B.Sounds, Abd Soft, Non tender, No organomegaly appriciated, No rebound -guarding or rigidity. No Cyanosis, Clubbing or edema, No new Rash or bruise  Data Review   CBC w Diff: Lab Results  Component Value Date   WBC 9.7 01/05/2015   WBC 7.6 06/04/2014   HGB 9.6* 01/05/2015   HGB 12.4 06/04/2014   HCT 29.2* 01/05/2015   HCT 37.9 06/04/2014   PLT 201 01/05/2015   PLT 165 06/04/2014   LYMPHOPCT 23 12/31/2014   LYMPHOPCT 8.2 06/04/2014   MONOPCT 10 12/31/2014   MONOPCT 9.9 06/04/2014   EOSPCT 4 12/31/2014   EOSPCT 2.6 06/04/2014   BASOPCT 1 12/31/2014   BASOPCT 0.5 06/04/2014    CMP: Lab Results  Component Value Date   NA 136 01/05/2015   NA 136 06/01/2014   K 4.3 01/05/2015   K 3.9 06/01/2014   CL 104 01/05/2015   CL 100 06/01/2014   CO2 27 01/05/2015   CO2 28 06/01/2014    BUN 15 01/05/2015   BUN 14 06/01/2014   CREATININE 0.61 01/05/2015   CREATININE 0.82 06/01/2014   PROT 5.6* 01/04/2015   PROT 6.7 06/01/2014   ALBUMIN 2.6* 01/04/2015   ALBUMIN 3.2* 06/01/2014   BILITOT 0.8 01/04/2015   ALKPHOS 36* 01/04/2015   ALKPHOS 42* 06/01/2014   AST 22 01/04/2015   AST 26 06/01/2014   ALT 13* 01/04/2015   ALT 22 06/01/2014  .   Total Time in preparing  paper work, data evaluation and todays exam - 35 minutes  Jedrek Dinovo M.D on 01/08/2015 at 8:36 AM

## 2015-01-08 NOTE — Progress Notes (Signed)
Pt confused. Alert to self however patient able to respond appropriately at times during interaction. VSS. Complaints of pain only when turned onto side. A.fibb on tele. Incontinent. Perineal given as needed.  Pt continuously yelled for help for about an hour. Attempted multiple times to calm patient. Pt was changed, given water and readjusted in bed. Pt continued to yell. PRN haldol given. Effective. Bath given this Am. Pt complained of dry mouth. Given mouth swab. Effective. Pt resting in bed. Will continue to monitor.

## 2015-01-08 NOTE — Clinical Social Work Note (Signed)
CSW notified facility, pt's daughter, pt, and RN that pt would DC back to her ALF with hospice following.  Pt will transport via  EMS.  CSW signing off.

## 2015-03-15 ENCOUNTER — Ambulatory Visit
Admission: EM | Admit: 2015-03-15 | Discharge: 2015-03-16 | Disposition: A | Discharge status: Other Acute Inpatient Hospital | Attending: Emergency Medicine | Admitting: Emergency Medicine

## 2015-03-15 ENCOUNTER — Ambulatory Visit

## 2015-03-15 ENCOUNTER — Other Ambulatory Visit: Payer: Self-pay

## 2015-03-15 DIAGNOSIS — F0390 Unspecified dementia without behavioral disturbance: Secondary | ICD-10-CM | POA: Insufficient documentation

## 2015-03-15 DIAGNOSIS — E119 Type 2 diabetes mellitus without complications: Secondary | ICD-10-CM | POA: Insufficient documentation

## 2015-03-15 DIAGNOSIS — E039 Hypothyroidism, unspecified: Secondary | ICD-10-CM | POA: Insufficient documentation

## 2015-03-15 DIAGNOSIS — S50311A Abrasion of right elbow, initial encounter: Secondary | ICD-10-CM | POA: Insufficient documentation

## 2015-03-15 DIAGNOSIS — S42292B Other displaced fracture of upper end of left humerus, initial encounter for open fracture: Secondary | ICD-10-CM | POA: Diagnosis not present

## 2015-03-15 DIAGNOSIS — M25512 Pain in left shoulder: Secondary | ICD-10-CM | POA: Diagnosis present

## 2015-03-15 DIAGNOSIS — Z79891 Long term (current) use of opiate analgesic: Secondary | ICD-10-CM | POA: Diagnosis not present

## 2015-03-15 DIAGNOSIS — K219 Gastro-esophageal reflux disease without esophagitis: Secondary | ICD-10-CM | POA: Diagnosis not present

## 2015-03-15 DIAGNOSIS — W19XXXA Unspecified fall, initial encounter: Secondary | ICD-10-CM

## 2015-03-15 DIAGNOSIS — Z79899 Other long term (current) drug therapy: Secondary | ICD-10-CM | POA: Insufficient documentation

## 2015-03-15 DIAGNOSIS — F419 Anxiety disorder, unspecified: Secondary | ICD-10-CM | POA: Diagnosis not present

## 2015-03-15 DIAGNOSIS — S12100A Unspecified displaced fracture of second cervical vertebra, initial encounter for closed fracture: Secondary | ICD-10-CM | POA: Insufficient documentation

## 2015-03-15 DIAGNOSIS — Z792 Long term (current) use of antibiotics: Secondary | ICD-10-CM | POA: Insufficient documentation

## 2015-03-15 DIAGNOSIS — I6782 Cerebral ischemia: Secondary | ICD-10-CM | POA: Diagnosis not present

## 2015-03-15 DIAGNOSIS — S42302B Unspecified fracture of shaft of humerus, left arm, initial encounter for open fracture: Secondary | ICD-10-CM

## 2015-03-15 DIAGNOSIS — W1830XA Fall on same level, unspecified, initial encounter: Secondary | ICD-10-CM | POA: Insufficient documentation

## 2015-03-15 LAB — CBC WITH DIFFERENTIAL/PLATELET
BASOS PCT: 1 %
Basophils Absolute: 0.2 10*3/uL — ABNORMAL HIGH (ref 0–0.1)
EOS ABS: 0.4 10*3/uL (ref 0–0.7)
Eosinophils Relative: 2 %
HCT: 39.5 % (ref 35.0–47.0)
HEMOGLOBIN: 12.9 g/dL (ref 12.0–16.0)
LYMPHS PCT: 5 %
Lymphs Abs: 0.9 10*3/uL — ABNORMAL LOW (ref 1.0–3.6)
MCH: 29.5 pg (ref 26.0–34.0)
MCHC: 32.7 g/dL (ref 32.0–36.0)
MCV: 90.3 fL (ref 80.0–100.0)
MONOS PCT: 5 %
Monocytes Absolute: 1 10*3/uL — ABNORMAL HIGH (ref 0.2–0.9)
NEUTROS PCT: 87 %
Neutro Abs: 16.5 10*3/uL — ABNORMAL HIGH (ref 1.4–6.5)
Platelets: 188 10*3/uL (ref 150–440)
RBC: 4.37 MIL/uL (ref 3.80–5.20)
RDW: 15.6 % — AB (ref 11.5–14.5)
WBC: 18.9 10*3/uL — ABNORMAL HIGH (ref 3.6–11.0)

## 2015-03-15 LAB — PROTIME-INR
INR: 1.19
Prothrombin Time: 15.3 seconds — ABNORMAL HIGH (ref 11.4–15.0)

## 2015-03-15 LAB — BASIC METABOLIC PANEL
Anion gap: 10 (ref 5–15)
BUN: 17 mg/dL (ref 6–20)
CALCIUM: 8.8 mg/dL — AB (ref 8.9–10.3)
CO2: 26 mmol/L (ref 22–32)
Chloride: 98 mmol/L — ABNORMAL LOW (ref 101–111)
Creatinine, Ser: 1.19 mg/dL — ABNORMAL HIGH (ref 0.44–1.00)
GFR calc Af Amer: 44 mL/min — ABNORMAL LOW (ref >60)
GFR calc non Af Amer: 38 mL/min — ABNORMAL LOW (ref >60)
Glucose, Bld: 118 mg/dL — ABNORMAL HIGH (ref 65–99)
Potassium: 4.5 mmol/L (ref 3.5–5.1)
Sodium: 134 mmol/L — ABNORMAL LOW (ref 135–145)

## 2015-03-15 MED ORDER — CEFAZOLIN SODIUM 1-5 GM-% IV SOLN
1.0000 g | Freq: Once | INTRAVENOUS | Status: AC
  Administered 2015-03-15: 1 g via INTRAVENOUS
  Filled 2015-03-15: qty 50

## 2015-03-15 MED ORDER — FENTANYL CITRATE (PF) 100 MCG/2ML IJ SOLN
50.0000 ug | Freq: Once | INTRAMUSCULAR | Status: AC
  Administered 2015-03-15: 50 ug via INTRAVENOUS
  Filled 2015-03-15: qty 2

## 2015-03-15 MED ORDER — ONDANSETRON HCL 4 MG/2ML IJ SOLN
INTRAMUSCULAR | Status: AC
  Administered 2015-03-15: 4 mg via INTRAVENOUS
  Filled 2015-03-15: qty 2

## 2015-03-15 MED ORDER — TETANUS-DIPHTH-ACELL PERTUSSIS 5-2.5-18.5 LF-MCG/0.5 IM SUSP: 0.5000 mL | Freq: Once | INTRAMUSCULAR | Status: DC

## 2015-03-15 MED ORDER — MORPHINE SULFATE 4 MG/ML IJ SOLN
4.0000 mg | Freq: Once | INTRAMUSCULAR | Status: AC
  Administered 2015-03-15: 4 mg via INTRAVENOUS

## 2015-03-15 MED ORDER — HYDROMORPHONE HCL 1 MG/ML IJ SOLN
1.0000 mg | Freq: Once | INTRAMUSCULAR | Status: AC
  Administered 2015-03-15: 1 mg via INTRAVENOUS
  Filled 2015-03-15: qty 1

## 2015-03-15 MED ORDER — MORPHINE SULFATE 4 MG/ML IJ SOLN
INTRAMUSCULAR | Status: AC
  Administered 2015-03-15: 4 mg via INTRAVENOUS
  Filled 2015-03-15: qty 1

## 2015-03-15 MED ORDER — ONDANSETRON HCL 4 MG/2ML IJ SOLN
4.0000 mg | Freq: Once | INTRAMUSCULAR | Status: AC
Start: 2015-03-15 — End: 2015-03-15
  Administered 2015-03-15: 4 mg via INTRAVENOUS

## 2015-03-15 NOTE — ED Provider Notes (Signed)
-----------------------------------------   11:10 PM on 03/15/2015 -----------------------------------------  Assumed care of patient. [facility] transfer center contacted. CTA held due to low GFR as well as patient will likely receive additional imaging studies at tertiary care center; will hold IV dye load currently. [facility] to call back.  ----------------------------------------- 11:46 PM on 03/15/2015 -----------------------------------------  Accepted by Dr. Clinton Sawyer [facility] ED). Agrees with holding on CTA at our facility. Family updated; transport called.  Irean Hong, MD 03/16/15 [phone]

## 2015-03-15 NOTE — ED Notes (Signed)
Pt presents to ED from [facility] [place] via [facility] d/t a witnessed fall. Per EMS, pt was observed trying to stand up, lost her balanced and hit her shoulder against the wall. Pt's left shoulder is grossly deformed, with an open skin tear noted to the pt's right elbow. Pt confused with dementia at baseline, as she is a pt at the Memory [facility] unit where she resides. Pt was given 100 mcg Fentanyl IV PTA to the ED.

## 2015-03-15 NOTE — ED Provider Notes (Signed)
CSN: [id number]     Arrival date & time 03/15/15  2028 History   First MD Initiated Contact with Patient 03/15/15 2037     Chief Complaint  Patient presents with  . Fall     (Consider location/radiation/quality/duration/timing/severity/associated sxs/prior Treatment) The history is provided by the patient and the EMS personnel. The history is limited by the condition of the patient.  Madison Wallace is a 81 y.o. female hx of dementia, anxiety, DM here with fall. She lives [facility] the nursing home and had a mechanical fall yesterday and fell on R elbow. Today, had another mechanical fall and fell on left shoulder. Denies syncope. She states that she may have hit her head. She has severe left shoulder pain afterwards. Also was noted to have right elbow abrasion. Unknown tetanus shot. Given 100 mcg fentanyl by EMS.    Level V caveat- dementia   Past Medical History  Diagnosis Date  . Dementia   . Generalized muscle weakness   . Diverticulosis   . Anxiety   . Hypothyroidism   . Diabetes mellitus without complication     Diet Controlled  . GERD (gastroesophageal reflux disease)    Past Surgical History  Procedure Laterality Date  . Compression hip screw Right 12/31/2014    Procedure: COMPRESSION HIP;  Surgeon: Christena Flake, MD;  Location: [facility] ORS;  Service: Orthopedics;  Laterality: Right;   Family History  Problem Relation Age of Onset  . Hypertension Mother    History  Substance Use Topics  . Smoking status: Never Smoker   . Smokeless tobacco: Never Used  . Alcohol Use: No   OB History    No data available     Review of Systems  Musculoskeletal:       L shoulder pain   Skin: Positive for wound.  All other systems reviewed and are negative.     Allergies  Amoxicillin and Penicillins  Home Medications   Prior to Admission medications   Medication Sig Start Date End Date Taking? Authorizing Provider  acetaminophen (TYLENOL) 325 MG tablet Take 650 mg by mouth 2 (two)  times daily as needed for mild pain.   Yes Historical Provider, MD  alum & mag hydroxide-simeth (MAALOX/MYLANTA) 200-200-20 MG/5ML suspension Take 30 mLs by mouth every 6 (six) hours as needed for indigestion or heartburn (dyspepsia). 01/08/15  Yes Katha Hamming, MD  busPIRone (BUSPAR) 15 MG tablet Take 15 mg by mouth 3 (three) times daily.   Yes Historical Provider, MD  donepezil (ARICEPT) 10 MG tablet Take 10 mg by mouth at bedtime.   Yes Historical Provider, MD  HYDROcodone-acetaminophen (NORCO/VICODIN) 5-325 MG per tablet Take 1 tablet by mouth 2 (two) times daily.   Yes Historical Provider, MD  levothyroxine (SYNTHROID, LEVOTHROID) 25 MCG tablet Take 25 mcg by mouth daily.   Yes Historical Provider, MD  LORazepam (ATIVAN) 0.5 MG tablet Take 0.5 tablets (0.25 mg total) by mouth at bedtime. 01/03/15  Yes Auburn Bilberry, MD  LORazepam (ATIVAN) 0.5 MG tablet Take 0.5 tablets (0.25 mg total) by mouth every 6 (six) hours as needed for anxiety. 01/08/15  Yes Katha Hamming, MD  metoprolol tartrate (LOPRESSOR) 25 MG tablet Take 12.5 mg by mouth 2 (two) times daily.   Yes Historical Provider, MD  Multiple Vitamins-Minerals (PRESERVISION AREDS 2) CAPS Take 1 capsule by mouth daily.   Yes Historical Provider, MD  mupirocin ointment (BACTROBAN) 2 % Apply 1 application topically daily. Pt applies to right heel.   Yes Historical Provider,  MD  OLANZapine (ZYPREXA) 2.5 MG tablet Take 2.5 mg by mouth every evening.   Yes Historical Provider, MD  pantoprazole (PROTONIX) 40 MG tablet Take 40 mg by mouth daily.   Yes Historical Provider, MD  polyethylene glycol (MIRALAX / GLYCOLAX) packet Take 17 g by mouth daily as needed for mild constipation.    Yes Historical Provider, MD  psyllium (REGULOID) 0.52 G capsule Take 2 capsules by mouth 2 (two) times daily.   Yes Historical Provider, MD  senna-docusate (SENOKOT-S) 8.6-50 MG per tablet Take 1 tablet by mouth 2 (two) times daily.   Yes Historical Provider, MD   sertraline (ZOLOFT) 100 MG tablet Take 100 mg by mouth daily.   Yes Historical Provider, MD  Skin Protectants, Misc. (ENDIT EX) Apply 1 application topically 2 (two) times daily.   Yes Historical Provider, MD  sulfamethoxazole-trimethoprim (BACTRIM DS,SEPTRA DS) 800-160 MG per tablet Take 1 tablet by mouth 2 (two) times daily. 03/11/15 03/20/15 Yes Historical Provider, MD  traMADol (ULTRAM) 50 MG tablet Take 1 tablet (50 mg total) by mouth every 6 (six) hours as needed. Patient taking differently: Take 50 mg by mouth every 8 (eight) hours as needed for moderate pain.  01/03/15  Yes Auburn Bilberry, MD  vitamin B-12 (CYANOCOBALAMIN) 1000 MCG tablet Take 1,000 mcg by mouth daily.   Yes Historical Provider, MD  calamine lotion Apply topically 2 (two) times daily as needed for itching (or dry skin). Patient not taking: Reported on 03/15/2015 01/03/15   Auburn Bilberry, MD  ciprofloxacin (CIPRO) 500 MG tablet Take 1 tablet (500 mg total) by mouth 2 (two) times daily. Patient not taking: Reported on 03/15/2015 01/08/15   Katha Hamming, MD  metoprolol (LOPRESSOR) 50 MG tablet Take 1 tablet (50 mg total) by mouth 2 (two) times daily. Patient not taking: Reported on 03/15/2015 01/08/15   Katha Hamming, MD  OLANZapine zydis (ZYPREXA) 5 MG disintegrating tablet Take 1 tablet (5 mg total) by mouth at bedtime. Patient not taking: Reported on 03/15/2015 01/08/15   Katha Hamming, MD   BP 148/76 mmHg  Pulse 86  Temp(Src) 97.5 F (36.4 C) (Oral)  Resp 18  Ht 5\' 5"  (1.651 m)  Wt 110 lb (49.896 kg)  BMI 18.31 kg/m2  SpO2 93% Physical Exam  Constitutional:  Chronically ill, screaming in pain   HENT:  Head: Normocephalic.  Mouth/Throat: Oropharynx is clear and moist.  No obvious scalp hematoma   Eyes: Conjunctivae are normal. Pupils are equal, round, and reactive to light.  Neck: Normal range of motion. Neck supple.  Cardiovascular: Normal rate, regular rhythm and normal heart sounds.    Pulmonary/Chest: Effort normal and breath sounds normal. No respiratory distress. She has no wheezes. She has no rales.  Abdominal: Soft. Bowel sounds are normal. She exhibits no distension. There is no tenderness. There is no rebound.  Musculoskeletal:  L shoulder with abrasion and bone fragment underneath with small area of open fracture. 2+ radial pulse, nl ROM L elbow. R elbow with abrasion, nl ROM. Nl ROM bilateral hips. Pelvis stable  Neurological: She is alert.  Demented   Skin: Skin is warm and dry.  Psychiatric: She has a normal mood and affect. Her behavior is normal. Judgment and thought content normal.  Nursing note and vitals reviewed.   ED Course  Procedures (including critical care time)  CRITICAL CARE Performed by: Silverio Lay, Jevon Shells   Total critical care time: 30 min   Critical care time was exclusive of separately billable procedures and treating  other patients.  Critical care was necessary to treat or prevent imminent or life-threatening deterioration.  Critical care was time spent personally by me on the following activities: development of treatment plan with patient and/or surrogate as well as nursing, discussions with consultants, evaluation of patient's response to treatment, examination of patient, obtaining history from patient or surrogate, ordering and performing treatments and interventions, ordering and review of laboratory studies, ordering and review of radiographic studies, pulse oximetry and re-evaluation of patient's condition.   Labs Review Labs Reviewed  CBC WITH DIFFERENTIAL/PLATELET  BASIC METABOLIC PANEL  PROTIME-INR    Imaging Review Ct Head Wo Contrast  03/15/2015   CLINICAL DATA:  Witnessed fall, confusion, baseline dementia, history diabetes mellitus  EXAM: CT HEAD WITHOUT CONTRAST  CT CERVICAL SPINE WITHOUT CONTRAST  TECHNIQUE: Multidetector CT imaging of the head and cervical spine was performed following the standard protocol without  intravenous contrast. Multiplanar CT image reconstructions of the cervical spine were also generated.  COMPARISON:  CT head 12/24/2008  FINDINGS: CT HEAD FINDINGS  Streak artifacts at brainstem.  Generalized atrophy.  Normal ventricular morphology.  No midline shift or mass effect.  Small vessel chronic ischemic changes of deep cerebral white matter.  Small old lacunar infarcts RIGHT caudate head and LEFT thalamus.  No intracranial hemorrhage, mass lesion or evidence acute infarction.  No extra-axial fluid collections.  Tiny amount of fluid dependently in sphenoid sinus.  Bones and sinuses otherwise unremarkable.  Atherosclerotic calcifications at skullbase.  CT CERVICAL SPINE FINDINGS  Osseous demineralization.  Prevertebral soft tissues normal thickness.  Scattered disc space narrowing and minimal endplate spur formation.  Vertebral body heights maintained.  Vertically oriented lucency is identified within the RIGHT lateral mass of C2, extending into the RIGHT vertebral foramen compatible with nondisplaced fracture.  No additional fracture or subluxation.  Odontoid process appears intact.  Mild scattered facet degenerative changes bilaterally.  Visualized skullbase intact.  Scattered carotid atherosclerotic calcifications.  Lung apices clear.  IMPRESSION: Atrophy with mild small vessel chronic ischemic changes of deep cerebral white matter.  Old lacunar infarcts RIGHT caudate head and LEFT thalamus.  No acute intracranial abnormalities.  Nondisplaced fracture through the RIGHT lateral mass of C2, extending into the LEFT vertebral foramen; vertebral arterial injury not excluded.  This can be further assessed by CTA imaging of the neck.  Findings called to Dr. Silverio Lay on 03/15/2015 at 2135 hours.   Electronically Signed   By: Ulyses Southward M.D.   On: 03/15/2015 21:36   Ct Cervical Spine Wo Contrast  03/15/2015   CLINICAL DATA:  Witnessed fall, confusion, baseline dementia, history diabetes mellitus  EXAM: CT HEAD  WITHOUT CONTRAST  CT CERVICAL SPINE WITHOUT CONTRAST  TECHNIQUE: Multidetector CT imaging of the head and cervical spine was performed following the standard protocol without intravenous contrast. Multiplanar CT image reconstructions of the cervical spine were also generated.  COMPARISON:  CT head 12/24/2008  FINDINGS: CT HEAD FINDINGS  Streak artifacts at brainstem.  Generalized atrophy.  Normal ventricular morphology.  No midline shift or mass effect.  Small vessel chronic ischemic changes of deep cerebral white matter.  Small old lacunar infarcts RIGHT caudate head and LEFT thalamus.  No intracranial hemorrhage, mass lesion or evidence acute infarction.  No extra-axial fluid collections.  Tiny amount of fluid dependently in sphenoid sinus.  Bones and sinuses otherwise unremarkable.  Atherosclerotic calcifications at skullbase.  CT CERVICAL SPINE FINDINGS  Osseous demineralization.  Prevertebral soft tissues normal thickness.  Scattered disc space narrowing and  minimal endplate spur formation.  Vertebral body heights maintained.  Vertically oriented lucency is identified within the RIGHT lateral mass of C2, extending into the RIGHT vertebral foramen compatible with nondisplaced fracture.  No additional fracture or subluxation.  Odontoid process appears intact.  Mild scattered facet degenerative changes bilaterally.  Visualized skullbase intact.  Scattered carotid atherosclerotic calcifications.  Lung apices clear.  IMPRESSION: Atrophy with mild small vessel chronic ischemic changes of deep cerebral white matter.  Old lacunar infarcts RIGHT caudate head and LEFT thalamus.  No acute intracranial abnormalities.  Nondisplaced fracture through the RIGHT lateral mass of C2, extending into the LEFT vertebral foramen; vertebral arterial injury not excluded.  This can be further assessed by CTA imaging of the neck.  Findings called to Dr. Silverio Lay on 03/15/2015 at 2135 hours.   Electronically Signed   By: Ulyses Southward M.D.   On:  03/15/2015 21:36     EKG Interpretation None      ED ECG REPORT   Date: 03/15/2015  EKG Time: 9:48 PM  Rate: 93  Rhythm: normal sinus rhythm,  normal EKG, normal sinus rhythm, unchanged from previous tracings  Axis: normal  Intervals:none  ST&T Change: nonspecific  Narrative Interpretation:               MDM   Final diagnoses:  Fall  Fall    Felicha Frayne is a 81 y.o. female here with fall. Concerned for possible open fracture vs dislocation. Will get CT head/neck, xrays, preop labs. Will update tdap and give antibiotics.   9:40 PM Patient has C2 fracture extending into the L vetebral foramen. C collar placed. CTA ordered. Also has L humeral head fracture with exposed bone. Given Tdap and ancef.   11:03 PM Signed out to Dr. Dolores Frame to f/u CT angio and transfer to [facility].     Richardean Canal, MD 03/15/15 [phone]

## 2015-07-29 ENCOUNTER — Emergency Department
Admission: EM | Admit: 2015-07-29 | Discharge: 2015-07-29 | Disposition: A | Discharge status: Home / Self Care | Payer: Medicare Other | Attending: Emergency Medicine | Admitting: Emergency Medicine

## 2015-07-29 ENCOUNTER — Encounter: Payer: Self-pay | Admitting: Emergency Medicine

## 2015-07-29 ENCOUNTER — Emergency Department: Payer: Medicare Other

## 2015-07-29 DIAGNOSIS — S0990XA Unspecified injury of head, initial encounter: Secondary | ICD-10-CM | POA: Diagnosis present

## 2015-07-29 DIAGNOSIS — Z880 Allergy status to penicillin: Secondary | ICD-10-CM | POA: Insufficient documentation

## 2015-07-29 DIAGNOSIS — E119 Type 2 diabetes mellitus without complications: Secondary | ICD-10-CM | POA: Diagnosis not present

## 2015-07-29 DIAGNOSIS — S0003XA Contusion of scalp, initial encounter: Secondary | ICD-10-CM | POA: Diagnosis not present

## 2015-07-29 DIAGNOSIS — Z23 Encounter for immunization: Secondary | ICD-10-CM | POA: Diagnosis not present

## 2015-07-29 DIAGNOSIS — Y998 Other external cause status: Secondary | ICD-10-CM | POA: Diagnosis not present

## 2015-07-29 DIAGNOSIS — W1839XA Other fall on same level, initial encounter: Secondary | ICD-10-CM | POA: Insufficient documentation

## 2015-07-29 DIAGNOSIS — Z79899 Other long term (current) drug therapy: Secondary | ICD-10-CM | POA: Insufficient documentation

## 2015-07-29 DIAGNOSIS — Y9389 Activity, other specified: Secondary | ICD-10-CM | POA: Insufficient documentation

## 2015-07-29 DIAGNOSIS — Y9289 Other specified places as the place of occurrence of the external cause: Secondary | ICD-10-CM | POA: Insufficient documentation

## 2015-07-29 DIAGNOSIS — Z792 Long term (current) use of antibiotics: Secondary | ICD-10-CM | POA: Diagnosis not present

## 2015-07-29 MED ORDER — TETANUS-DIPHTHERIA TOXOIDS TD 5-2 LFU IM INJ
0.5000 mL | INJECTION | Freq: Once | INTRAMUSCULAR | Status: AC
  Administered 2015-07-29: 0.5 mL via INTRAMUSCULAR
  Filled 2015-07-29: qty 0.5

## 2015-07-29 NOTE — ED Notes (Signed)
Per EMS: 81 yo patient from [facility] with history of dementia brought in via [facility] for witnessed fall.  No loss of consciousness, according to staff patient is at her baseline mental status, repeats questions, alert to self.  Has small superficial 1.5 cm lac to posterior head, bled scantly, no active bleeding at this time.   Vital signs are stable.  Also c/o right hip pain briefly, although this is not reproducible with palpation or range of motion.  No obvious deformities or injuries noted other than the posterior head lac.

## 2015-07-29 NOTE — Discharge Instructions (Signed)

## 2015-07-29 NOTE — ED Provider Notes (Signed)
[facility] Emergency Department Provider Note  ____________________________________________  Time seen: 8:55 PM  I have reviewed the triage vital signs and the nursing notes.   HISTORY  Chief Complaint Fall and Head Injury  History Limited due to patient's chronic dementia  HPI Madison Wallace is a 81 y.o. female is brought to the ED from [facility] due to a witnessed fall. No loss of consciousness. She is reported to be at her baseline mental status. There was some bleeding from the back of the head initially. Patient denies any complaints other than some pain in the back of the head.     Past Medical History  Diagnosis Date  . Dementia   . Generalized muscle weakness   . Diverticulosis   . Anxiety   . Hypothyroidism   . Diabetes mellitus without complication (HCC)     Diet Controlled  . GERD (gastroesophageal reflux disease)      Patient Active Problem List   Diagnosis Date Noted  . PNA (pneumonia) 01/06/2015  . Afib (HCC) 01/04/2015  . Intertrochanteric fracture of right femur (HCC) 12/31/2014     Past Surgical History  Procedure Laterality Date  . Compression hip screw Right 12/31/2014    Procedure: COMPRESSION HIP;  Surgeon: Christena Flake, MD;  Location: [facility] ORS;  Service: Orthopedics;  Laterality: Right;     Current Outpatient Rx  Name  Route  Sig  Dispense  Refill  . acetaminophen (TYLENOL) 325 MG tablet   Oral   Take 650 mg by mouth 2 (two) times daily as needed for mild pain.         Marland Kitchen alum & mag hydroxide-simeth (MAALOX/MYLANTA) 200-200-20 MG/5ML suspension   Oral   Take 30 mLs by mouth every 6 (six) hours as needed for indigestion or heartburn (dyspepsia).   355 mL   0   . busPIRone (BUSPAR) 15 MG tablet   Oral   Take 15 mg by mouth 3 (three) times daily.         . calamine lotion   Topical   Apply topically 2 (two) times daily as needed for itching (or dry skin). Patient not taking: Reported on 03/15/2015   120 mL   0   . ciprofloxacin (CIPRO) 500 MG tablet   Oral   Take 1 tablet (500 mg total) by mouth 2 (two) times daily. Patient not taking: Reported on 03/15/2015   10 tablet   0   . donepezil (ARICEPT) 10 MG tablet   Oral   Take 10 mg by mouth at bedtime.         Marland Kitchen HYDROcodone-acetaminophen (NORCO/VICODIN) 5-325 MG per tablet   Oral   Take 1 tablet by mouth 2 (two) times daily.         Marland Kitchen levothyroxine (SYNTHROID, LEVOTHROID) 25 MCG tablet   Oral   Take 25 mcg by mouth daily.         Marland Kitchen LORazepam (ATIVAN) 0.5 MG tablet   Oral   Take 0.5 tablets (0.25 mg total) by mouth at bedtime.   30 tablet   0   . LORazepam (ATIVAN) 0.5 MG tablet   Oral   Take 0.5 tablets (0.25 mg total) by mouth every 6 (six) hours as needed for anxiety.   30 tablet   0   . metoprolol (LOPRESSOR) 50 MG tablet   Oral   Take 1 tablet (50 mg total) by mouth 2 (two) times daily. Patient not taking: Reported on 03/15/2015  60 tablet   0   . metoprolol tartrate (LOPRESSOR) 25 MG tablet   Oral   Take 12.5 mg by mouth 2 (two) times daily.         . Multiple Vitamins-Minerals (PRESERVISION AREDS 2) CAPS   Oral   Take 1 capsule by mouth daily.         . mupirocin ointment (BACTROBAN) 2 %   Topical   Apply 1 application topically daily. Pt applies to right heel.         Marland Kitchen OLANZapine (ZYPREXA) 2.5 MG tablet   Oral   Take 2.5 mg by mouth every evening.         Marland Kitchen OLANZapine zydis (ZYPREXA) 5 MG disintegrating tablet   Oral   Take 1 tablet (5 mg total) by mouth at bedtime. Patient not taking: Reported on 03/15/2015   30 tablet   0   . pantoprazole (PROTONIX) 40 MG tablet   Oral   Take 40 mg by mouth daily.         . polyethylene glycol (MIRALAX / GLYCOLAX) packet   Oral   Take 17 g by mouth daily as needed for mild constipation.          . psyllium (REGULOID) 0.52 G capsule   Oral   Take 2 capsules by mouth 2 (two) times daily.         Marland Kitchen senna-docusate (SENOKOT-S)  8.6-50 MG per tablet   Oral   Take 1 tablet by mouth 2 (two) times daily.         . sertraline (ZOLOFT) 100 MG tablet   Oral   Take 100 mg by mouth daily.         . Skin Protectants, Misc. (ENDIT EX)   Apply externally   Apply 1 application topically 2 (two) times daily.         . traMADol (ULTRAM) 50 MG tablet   Oral   Take 1 tablet (50 mg total) by mouth every 6 (six) hours as needed. Patient taking differently: Take 50 mg by mouth every 8 (eight) hours as needed for moderate pain.    30 tablet   0   . vitamin B-12 (CYANOCOBALAMIN) 1000 MCG tablet   Oral   Take 1,000 mcg by mouth daily.            Allergies Amoxicillin and Penicillins   Family History  Problem Relation Age of Onset  . Hypertension Mother     Social History Social History  Substance Use Topics  . Smoking status: Never Smoker   . Smokeless tobacco: Never Used  . Alcohol Use: No    Review of Systems  Constitutional:   No fever or chills. No weight changes Eyes:   No blurry vision or double vision.  ENT:   No sore throat. Cardiovascular:   No chest pain. Respiratory:   No dyspnea or cough. Gastrointestinal:   Negative for abdominal pain, vomiting and diarrhea.  No BRBPR or melena. Genitourinary:   Negative for dysuria, urinary retention, bloody urine, or difficulty urinating. Musculoskeletal:   Negative for back pain. No joint swelling or pain. Skin:   Negative for rash. Neurological:   Negative for headaches, focal weakness or numbness. Psychiatric:  No anxiety or depression.   Endocrine:  No hot/cold intolerance, changes in energy, or sleep difficulty.  10-point ROS otherwise negative.  ____________________________________________   PHYSICAL EXAM:  VITAL SIGNS: ED Triage Vitals  Enc Vitals Group     BP 07/29/15  2030 149/55 mmHg     Pulse Rate 07/29/15 2030 55     Resp 07/29/15 2030 18     Temp 07/29/15 2030 98.6 F (37 C)     Temp Source 07/29/15 2030 Oral     SpO2  07/29/15 2030 97 %     Weight 07/29/15 2030 110 lb (49.896 kg)     Height 07/29/15 2030 5\' 4"  (1.626 m)     Head Cir --      [place] --      Pain Score --      Pain Loc --      Pain Edu? --      Excl. in GC? --      Constitutional:   Alert and orientedto person and place. Well appearing and in no distress. Eyes:   No scleral icterus. No conjunctival pallor. PERRL. EOMI ENT   Head:   Normocephalic with contusion and very slight skin tear on the occipital region of the scalp. No laceration. No hematoma. No palpable bony step-off tenderness or skull fracture   Nose:   No congestion/rhinnorhea. No septal hematoma   Mouth/Throat:   MMM, no pharyngeal erythema. No peritonsillar mass. No uvula shift.   Neck:   No stridor. No SubQ emphysema. No meningismus. Hematological/Lymphatic/Immunilogical:   No cervical lymphadenopathy. Cardiovascular:   RRR. Normal and symmetric distal pulses are present in all extremities. No murmurs, rubs, or gallops. Respiratory:   Normal respiratory effort without tachypnea nor retractions. Breath sounds are clear and equal bilaterally. No wheezes/rales/rhonchi. Gastrointestinal:   Soft and nontender. No distention. There is no CVA tenderness.  No rebound, rigidity, or guarding. Genitourinary:   deferred Musculoskeletal:   Nontender with normal range of motion in all extremities. No joint effusions.  No lower extremity tenderness.  No edema. Neurologic:   Normal speech, frequent repetitive questions. CN 2-10 normal. Motor grossly intact. No gross focal neurologic deficits are appreciated.  Skin:    Skin is warm, dry and intac texcept for scalp contusion.. No rash noted.  No petechiae, purpura, or bullae. Psychiatric:   Mood and affect are normal. Perseverating speech, limited insight ____________________________________________    LABS (pertinent positives/negatives) (all labs ordered are listed, but only abnormal results are displayed) Labs  Reviewed - No data to display ____________________________________________   EKG    ____________________________________________    RADIOLOGY  CT head does not show any acute findings  ____________________________________________   PROCEDURES   ____________________________________________   INITIAL IMPRESSION / ASSESSMENT AND PLAN / ED COURSE  Pertinent labs & imaging results that were available during my care of the patient were reviewed by me and considered in my medical decision making (see chart for details).  Patient presents with mechanical fall. Has a contusion to the back of the scalp. We'll get a tetanus shot to update her, CT head, discharge home if unremarkable. Exam is reassuring and does not reveal any evidence of significant traumatic injury.     ____________________________________________   FINAL CLINICAL IMPRESSION(S) / ED DIAGNOSES  Final diagnoses:  Contusion of occipital region of scalp, initial encounter      Sharman CheekPhillip Blessyn [place], MD 07/29/15 2202

## 2016-05-30 ENCOUNTER — Other Ambulatory Visit
Admission: RE | Admit: 2016-05-30 | Discharge: 2016-05-30 | Disposition: A | Discharge status: Home / Self Care | Payer: Medicare Other | Source: Ambulatory Visit | Attending: Family Medicine | Admitting: Family Medicine

## 2016-05-30 DIAGNOSIS — R197 Diarrhea, unspecified: Secondary | ICD-10-CM | POA: Diagnosis present

## 2016-05-30 LAB — GASTROINTESTINAL PANEL BY PCR, STOOL (REPLACES STOOL CULTURE)

## 2017-01-02 ENCOUNTER — Other Ambulatory Visit
Admission: RE | Admit: 2017-01-02 | Discharge: 2017-01-02 | Disposition: A | Discharge status: Home / Self Care | Payer: Medicare Other | Source: Ambulatory Visit | Attending: Family Medicine | Admitting: Family Medicine

## 2017-01-02 DIAGNOSIS — N390 Urinary tract infection, site not specified: Secondary | ICD-10-CM | POA: Insufficient documentation

## 2017-01-02 LAB — URINALYSIS, COMPLETE (UACMP) WITH MICROSCOPIC
BACTERIA UA: NONE SEEN
Bilirubin Urine: NEGATIVE
Glucose, UA: NEGATIVE mg/dL
Ketones, ur: NEGATIVE mg/dL
NITRITE: POSITIVE — AB
PROTEIN: 100 mg/dL — AB
SQUAMOUS EPITHELIAL / LPF: NONE SEEN
Specific Gravity, Urine: 1.011 (ref 1.005–1.030)
pH: 8 (ref 5.0–8.0)

## 2017-01-03 LAB — URINE CULTURE

## 2017-01-23 DEATH — deceased
